# Patient Record
Sex: Female | Born: 1975 | Race: White | Hispanic: No | Marital: Married | Smoking: Never smoker
Health system: Southern US, Community
[De-identification: ages and names within clinical notes are randomized; demographics above are authoritative.]

## PROBLEM LIST (undated history)

## (undated) DIAGNOSIS — K602 Anal fissure, unspecified: Secondary | ICD-10-CM

## (undated) DIAGNOSIS — K644 Residual hemorrhoidal skin tags: Secondary | ICD-10-CM

## (undated) DIAGNOSIS — A6920 Lyme disease, unspecified: Secondary | ICD-10-CM

## (undated) DIAGNOSIS — K589 Irritable bowel syndrome without diarrhea: Secondary | ICD-10-CM

## (undated) DIAGNOSIS — K649 Unspecified hemorrhoids: Secondary | ICD-10-CM

## (undated) DIAGNOSIS — K319 Disease of stomach and duodenum, unspecified: Secondary | ICD-10-CM

## (undated) DIAGNOSIS — Z9889 Other specified postprocedural states: Secondary | ICD-10-CM

## (undated) DIAGNOSIS — Z8489 Family history of other specified conditions: Secondary | ICD-10-CM

## (undated) DIAGNOSIS — K219 Gastro-esophageal reflux disease without esophagitis: Secondary | ICD-10-CM

## (undated) DIAGNOSIS — R112 Nausea with vomiting, unspecified: Secondary | ICD-10-CM

## (undated) DIAGNOSIS — K648 Other hemorrhoids: Secondary | ICD-10-CM

## (undated) DIAGNOSIS — M341 CR(E)ST syndrome: Secondary | ICD-10-CM

## (undated) HISTORY — DX: Disease of stomach and duodenum, unspecified: K31.9

## (undated) HISTORY — DX: Cr(e)st syndrome: M34.1

## (undated) HISTORY — PX: ABDOMINAL HYSTERECTOMY: SHX81

## (undated) HISTORY — DX: Other hemorrhoids: K64.8

## (undated) HISTORY — PX: INCONTINENCE SURGERY: SHX676

## (undated) HISTORY — PX: TONSILLECTOMY: SUR1361

## (undated) HISTORY — DX: Irritable bowel syndrome, unspecified: K58.9

## (undated) HISTORY — DX: Gastro-esophageal reflux disease without esophagitis: K21.9

## (undated) HISTORY — DX: Residual hemorrhoidal skin tags: K64.4

## (undated) HISTORY — PX: CHOLECYSTECTOMY: SHX55

---
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
## 2001-12-07 HISTORY — PX: DILATION AND CURETTAGE OF UTERUS: SHX78

## 2003-12-08 HISTORY — PX: VAGINAL HYSTERECTOMY: SUR661

## 2004-06-11 ENCOUNTER — Ambulatory Visit (HOSPITAL_COMMUNITY): Admission: RE | Admit: 2004-06-11 | Discharge: 2004-06-11 | Payer: Self-pay | Admitting: Obstetrics & Gynecology

## 2004-11-04 ENCOUNTER — Inpatient Hospital Stay (HOSPITAL_COMMUNITY): Admission: RE | Admit: 2004-11-04 | Discharge: 2004-11-06 | Payer: Self-pay | Admitting: Obstetrics & Gynecology

## 2005-06-17 ENCOUNTER — Ambulatory Visit (HOSPITAL_COMMUNITY): Admission: RE | Admit: 2005-06-17 | Discharge: 2005-06-17 | Payer: Self-pay | Admitting: Obstetrics and Gynecology

## 2005-11-11 ENCOUNTER — Ambulatory Visit: Payer: Self-pay | Admitting: Internal Medicine

## 2007-01-28 ENCOUNTER — Ambulatory Visit: Payer: Self-pay | Admitting: Internal Medicine

## 2010-08-05 ENCOUNTER — Encounter (INDEPENDENT_AMBULATORY_CARE_PROVIDER_SITE_OTHER): Payer: Self-pay | Admitting: *Deleted

## 2011-01-08 NOTE — Letter (Signed)
Summary: Unable to Reach, Consult Scheduled  [facility]  [address]   [place], [place] [address]   Phone: [phone]  Fax: [phone]    08/05/2010  Autumn Beasley [address] [place], [place]  [address] 06/02/1976   Dear [name],  At the recommendation of DR VYAS  we have been asked to schedule you a consult with DR Jena Gauss for INTERNAL HEMORRHOIDS. Please call our office at [phone].     Thank you,    Diana Eves  [facility] R. Roetta Sessions, M.D.    Jonette Eva, M.D. Lorenza Burton, FNP-BC    Tana Coast, PA-C Phone: [phone]    Fax: [phone]

## 2011-11-02 ENCOUNTER — Telehealth: Payer: Self-pay | Admitting: Internal Medicine

## 2011-11-02 NOTE — Telephone Encounter (Signed)
Pt scheduled to see Dr. Juanda Chance 11/04/11@2pm . Pt aware of appt date and time.

## 2011-11-02 NOTE — Telephone Encounter (Signed)
Last OV 01/28/2007 after a bout with gastroenteritis and hospitalization. She was better at the visit and was given PPI samples and instructed to take a Probiotic or eat Activia yogurt. She reports Atypical chest pain and n/v last week and saw her PCP who instructed her to change her PPI to Nexium and ordered an ECHO for today. Pt reports today the n/v has improved, but she feels hungry; if she eats, she gets n/v again. Pt given an appt on 11/04/11.

## 2011-11-03 ENCOUNTER — Encounter: Payer: Self-pay | Admitting: *Deleted

## 2011-11-04 ENCOUNTER — Encounter: Payer: Self-pay | Admitting: Internal Medicine

## 2011-11-04 ENCOUNTER — Ambulatory Visit (INDEPENDENT_AMBULATORY_CARE_PROVIDER_SITE_OTHER): Payer: BC Managed Care – PPO | Admitting: Internal Medicine

## 2011-11-04 VITALS — BP 100/78 | HR 72 | Ht 62.0 in | Wt 188.6 lb

## 2011-11-04 DIAGNOSIS — R1013 Epigastric pain: Secondary | ICD-10-CM

## 2011-11-04 DIAGNOSIS — R079 Chest pain, unspecified: Secondary | ICD-10-CM

## 2011-11-04 NOTE — Progress Notes (Signed)
Autumn Beasley 1976-06-04 MRN [id number]    History of Present Illness:  This is a 35 year old white female with persistent epigastric and subxiphoid pain which has been bothering her for several months. It is worse at night but also after meals. She was on Prilosec 20 mg a day and has been switched to Nexium 40 mg a day and most recently to Nexium 40 mg twice a day. She has not noticed any improvement. She denies dysphagia or odynophagia. She underwent a cholecystectomy in 1997. She was hospitalized at [facility] in February 2008 with acute gastroenteritis and post viral diarrhea. She has recovered from that. She has had some loose stools but no blood per rectum.    Past Medical History  Diagnosis Date  . Migraine   . GERD (gastroesophageal reflux disease)   . IBS (irritable bowel syndrome)   . Hemorrhoids   . Asthma    Past Surgical History  Procedure Date  . Cholecystectomy   . Abdominal hysterectomy     reports that she has never smoked. She has never used smokeless tobacco. She reports that she does not drink alcohol or use illicit drugs. family history includes Alcohol abuse in her maternal grandfather; COPD in her maternal aunt; Crohn's disease in her sister; Diabetes in her maternal aunt and maternal grandmother; and Liver disease in her maternal aunt. Allergies  Allergen Reactions  . Morphine And Related   . Penicillins         Review of Systems: Positive for chest pain but negative for shortness of breath. Denies rectal bleeding  The remainder of the 10 point ROS is negative except as outlined in H&P   Physical Exam: General appearance  Well developed, in no distress. Eyes- non icteric. HEENT nontraumatic, normocephalic. Mouth no lesions, tongue papillated, no cheilosis. Neck supple without adenopathy, thyroid not enlarged, no carotid bruits, no JVD. Lungs Clear to auscultation bilaterally. Cor normal S1, normal S2, regular rhythm, no murmur,  quiet  precordium. Abdomen: Obese soft with tenderness in epigastrium in the midline. No tenderness in the left or right upper quadrant. Liver edge at costal margin. No CVA tenderness. Rectal: Soft Hemoccult negative stool. Extremities no pedal edema. Skin no lesions. Neurological alert and oriented x 3. Psychological normal mood and affect.  Assessment and Plan:  Problem #1 Chronic epigastric and substernal discomfort is likely of GI origin suggestive of esophagitis or gastritis. We will rule out peptic ulcer disease or H. pylori gastritis.  Problem #2 Status post remote cholecystectomy. We will proceed with an upper abdominal ultrasound and upper endoscopy to further evaluate her complaints.   11/04/2011 Lina Sar

## 2011-11-04 NOTE — Patient Instructions (Addendum)
You have been scheduled for an endoscopy. Please follow written instructions given to you at your visit today. You have been scheduled for an abdominal ultrasound at [facility] (1st floor of hospital) on Monday, 11/09/11 at 8:30 am. Please arrive 15 minutes prior to your appointment for registration. Make certain not to have anything to eat or drink 6 hours prior to your appointment. Should you need to reschedule your appointment, please contact radiology at [phone]. CC: Dr Despina Hidden, Dr Marlyn Corporal

## 2011-11-05 ENCOUNTER — Encounter: Payer: Self-pay | Admitting: Internal Medicine

## 2011-11-06 ENCOUNTER — Telehealth: Payer: Self-pay | Admitting: Internal Medicine

## 2011-11-06 NOTE — Telephone Encounter (Signed)
Patient states that she was supposed to get a medication to "coat her stomach." Dr Juanda Chance, did you want me to send patient carafate slurry?

## 2011-11-06 NOTE — Telephone Encounter (Signed)
No medication was to be sent to my knowledge. Patient may call back with further questions (she did not answer the phone)

## 2011-11-07 NOTE — Telephone Encounter (Signed)
Please send Carafate slurry 10cc po bid, #12 oz, 1 refill

## 2011-11-09 ENCOUNTER — Other Ambulatory Visit (HOSPITAL_COMMUNITY): Payer: BC Managed Care – PPO

## 2011-11-09 ENCOUNTER — Ambulatory Visit (HOSPITAL_COMMUNITY)
Admission: RE | Admit: 2011-11-09 | Discharge: 2011-11-09 | Disposition: A | Discharge status: Home / Self Care | Payer: BC Managed Care – PPO | Source: Ambulatory Visit | Attending: Internal Medicine | Admitting: Internal Medicine

## 2011-11-09 DIAGNOSIS — R079 Chest pain, unspecified: Secondary | ICD-10-CM

## 2011-11-09 DIAGNOSIS — K3189 Other diseases of stomach and duodenum: Secondary | ICD-10-CM | POA: Insufficient documentation

## 2011-11-09 DIAGNOSIS — R1013 Epigastric pain: Secondary | ICD-10-CM | POA: Insufficient documentation

## 2011-11-09 MED ORDER — SUCRALFATE 1 GM/10ML PO SUSP: 1.0000 g | Freq: Two times a day (BID) | ORAL | Status: DC

## 2011-11-09 NOTE — Telephone Encounter (Signed)
rx sent. Left message to advise patient.  

## 2011-11-10 ENCOUNTER — Ambulatory Visit (AMBULATORY_SURGERY_CENTER): Payer: BC Managed Care – PPO | Admitting: Internal Medicine

## 2011-11-10 ENCOUNTER — Encounter: Payer: Self-pay | Admitting: Internal Medicine

## 2011-11-10 DIAGNOSIS — K2970 Gastritis, unspecified, without bleeding: Secondary | ICD-10-CM

## 2011-11-10 DIAGNOSIS — K2990 Gastroduodenitis, unspecified, without bleeding: Secondary | ICD-10-CM

## 2011-11-10 DIAGNOSIS — R1013 Epigastric pain: Secondary | ICD-10-CM

## 2011-11-10 DIAGNOSIS — R079 Chest pain, unspecified: Secondary | ICD-10-CM

## 2011-11-10 DIAGNOSIS — K319 Disease of stomach and duodenum, unspecified: Secondary | ICD-10-CM

## 2011-11-10 MED ORDER — SODIUM CHLORIDE 0.9 % IV SOLN: 500.0000 mL | INTRAVENOUS | Status: DC

## 2011-11-10 MED ORDER — HYOSCYAMINE SULFATE 0.125 MG SL SUBL: 0.1250 mg | SUBLINGUAL_TABLET | SUBLINGUAL | Status: DC | PRN

## 2011-11-10 NOTE — Progress Notes (Signed)
Patient did not have preoperative order for IV antibiotic SSI prophylaxis. (G8918)  Patient did not experience any of the following events: a burn prior to discharge; a fall within the facility; wrong site/side/patient/procedure/implant event; or a hospital transfer or hospital admission upon discharge from the facility. (G8907)  

## 2011-11-10 NOTE — Patient Instructions (Signed)
Please read the handout given to you by  Your recovery room nurse.    Take the medication prescribed for you by Dr. Juanda Chance.  You may resume your routine medications today.   Your biopsies will be mailed to you within two weeks if they are normal.   If they are abnormal, we will call you.  Thank-you.   Call us if you need [place] at [phone].

## 2011-11-11 ENCOUNTER — Telehealth: Payer: Self-pay | Admitting: *Deleted

## 2011-11-11 NOTE — Telephone Encounter (Signed)
NO ANSWER, MESSAGE LEFT FOR PATIENT. 

## 2011-11-16 ENCOUNTER — Encounter: Payer: Self-pay | Admitting: Internal Medicine

## 2012-04-05 ENCOUNTER — Telehealth: Payer: Self-pay | Admitting: Internal Medicine

## 2012-04-05 NOTE — Telephone Encounter (Signed)
Patient states for the last few weeks she has had bright, red blood in stool. She went for her yearly GYN exam today and had + hemoccult. She was told to f/u with GI. Scheduled patient on 04/08/12 at 3:30 PM with Mike Gip, PA

## 2012-04-08 ENCOUNTER — Ambulatory Visit: Payer: BC Managed Care – PPO | Admitting: Physician Assistant

## 2012-08-22 ENCOUNTER — Encounter: Payer: Self-pay | Admitting: *Deleted

## 2012-08-23 ENCOUNTER — Encounter: Payer: Self-pay | Admitting: Internal Medicine

## 2012-08-23 ENCOUNTER — Ambulatory Visit (INDEPENDENT_AMBULATORY_CARE_PROVIDER_SITE_OTHER): Payer: BC Managed Care – PPO | Admitting: Internal Medicine

## 2012-08-23 VITALS — BP 100/70 | HR 92 | Ht 62.0 in | Wt 170.4 lb

## 2012-08-23 DIAGNOSIS — R1012 Left upper quadrant pain: Secondary | ICD-10-CM

## 2012-08-23 DIAGNOSIS — K625 Hemorrhage of anus and rectum: Secondary | ICD-10-CM

## 2012-08-23 MED ORDER — MOVIPREP 100 G PO SOLR: ORAL | Status: DC

## 2012-08-23 MED ORDER — HYDROCORTISONE ACE-PRAMOXINE 2.5-1 % RE CREA: TOPICAL_CREAM | Freq: Two times a day (BID) | RECTAL | Status: DC | PRN

## 2012-08-23 MED ORDER — HYOSCYAMINE SULFATE 0.125 MG SL SUBL: 0.1250 mg | SUBLINGUAL_TABLET | SUBLINGUAL | Status: DC | PRN

## 2012-08-23 NOTE — Progress Notes (Signed)
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 It occurs several times a week and is often relieved by having a bowel movement. The pain lasts several minutes to several hours and it may move from the epigastrium to the left lower quadrant. We have seen her in the past for epigastric discomfort and moderately severe antral gastritis seen on an upper endoscopy in December 2012. She has not had any problems with dyspepsia this time. An upper abdominal ultrasound in November 2012 showed a 4 mm common bile duct and post cholecystectomy state. She has been experiencing bright red blood per rectum and some irritation and pain with bowel movements. She denies being constipated. She had severe acute gastroenteritis in 2008 while on a cruise. She has taken Nexium 40 mg in the past, but currently on an as needed basis. She has used over-the-counter hemorrhoidal preparations. There is no family history of colon cancer.   Past Medical History  Diagnosis Date  . Migraine   . GERD (gastroesophageal reflux disease)   . IBS (irritable bowel syndrome)   . Hemorrhoids   . Asthma   . Gastropathy    Past Surgical History  Procedure Date  . Cholecystectomy   . Abdominal hysterectomy     reports that she has never smoked. She has never used smokeless tobacco. She reports that she does not drink alcohol or use illicit drugs. family history includes Alcohol abuse in her maternal grandfather; COPD in her maternal aunt; Crohn's disease in her sister; Diabetes in her maternal aunt and maternal grandmother; and Liver disease in her maternal aunt. Allergies  Allergen Reactions  . Morphine And Related   . Penicillins         Review of Systems: Denies heartburn dysphagia weight changes  The remainder of the 10 point ROS is negative  except as outlined in H&P   Physical Exam: General appearance  Well developed, in no distress. Eyes- non icteric. HEENT nontraumatic, normocephalic. Mouth no lesions, tongue papillated, no cheilosis. Neck supple without adenopathy, thyroid not enlarged, no carotid bruits, no JVD. Lungs Clear to auscultation bilaterally. Cor normal S1, normal S2, regular rhythm, no murmur,  quiet precordium. Abdomen: Minimal discomfort along the left costal margin, spleen not enlarged. No palpable mass. No CVA tenderness. Rectal: Soft Hemoccult negative stool. Extremities no pedal edema. Skin no lesions. Neurological alert and oriented x 3. Psychological normal mood and affect.  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 The symptoms seem to be relieved by having a bowel movement. Rectal bleeding is a separate problem and suggests an ano- rectal source. Beause of the concern about rectal bleeding and persistence of her symptoms, we will go ahead with a colonoscopy. She will start Levsin sublingually 0.125 mg when necessary for left upper quadrant discomfort and she will also use Analpram cream 2.5% when necessary for rectal irritation. She will continue a high fiber diet.   08/23/2012 Lina Sar

## 2012-08-23 NOTE — Patient Instructions (Addendum)
You have been scheduled for a colonoscopy with propofol. Please follow written instructions given to you at your visit today.  Please pick up your prep kit at the pharmacy within the next 1-3 days. If you use inhalers (even only as needed), please bring them with you on the day of your procedure. We have sent the following medications to your pharmacy for you to pick up at your convenience: Levsin SL Analpram CC: Dr Algis Downs. Sherril Croon

## 2012-10-25 ENCOUNTER — Encounter: Payer: Self-pay | Admitting: Internal Medicine

## 2012-10-25 ENCOUNTER — Ambulatory Visit (AMBULATORY_SURGERY_CENTER): Payer: BC Managed Care – PPO | Admitting: Internal Medicine

## 2012-10-25 VITALS — BP 112/75 | HR 66 | Temp 98.5°F | Resp 45 | Ht 62.0 in | Wt 170.0 lb

## 2012-10-25 DIAGNOSIS — K625 Hemorrhage of anus and rectum: Secondary | ICD-10-CM

## 2012-10-25 DIAGNOSIS — R1012 Left upper quadrant pain: Secondary | ICD-10-CM

## 2012-10-25 MED ORDER — SODIUM CHLORIDE 0.9 % IV SOLN: 500.0000 mL | INTRAVENOUS | Status: DC

## 2012-10-25 NOTE — Progress Notes (Addendum)
Patient did not have preoperative order for IV antibiotic SSI prophylaxis. (G8918)  Patient did not experience any of the following events: a burn prior to discharge; a fall within the facility; wrong site/side/patient/procedure/implant event; or a hospital transfer or hospital admission upon discharge from the facility. (G8907)  

## 2012-10-25 NOTE — Op Note (Signed)
[facility] [address] [place], [address]   COLONOSCOPY PROCEDURE REPORT  PATIENT: Autumn, Beasley  MR#: [id number] BIRTHDATE: 18-Dec-1975 , 36  yrs. old GENDER: Female ENDOSCOPIST: Hart Carwin, MD REFERRED BY:  Doreen Beam, M.D. PROCEDURE DATE:  10/25/2012 PROCEDURE:   Colonoscopy, diagnostic ASA CLASS:   Class I INDICATIONS:abdominal pain in the upper left quadrant and hematochezia. MEDICATIONS: MAC sedation, administered by CRNA and propofol (Diprivan) 300mg  IV  DESCRIPTION OF PROCEDURE:   After the risks and benefits and of the procedure were explained, informed consent was obtained.  A digital rectal exam revealed no abnormalities of the rectum.    The LB [id number]  endoscope was introduced through the anus and advanced to the cecum, which was identified by both the appendix and ileocecal valve .  The quality of the prep was good, using MoviPrep .  The instrument was then slowly withdrawn as the colon was fully examined.     COLON FINDINGS: Internal hemorrhoids were found.     Retroflexed views revealed no abnormalities.     The scope was then withdrawn from the patient and the procedure completed.  COMPLICATIONS: There were no complications. ENDOSCOPIC IMPRESSION: Internal hemorrhoids , nothing to account for the LUQ abd. 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 Pt reported resolution of the pain after the bowl prep  RECOMMENDATIONS: High fiber diet continue Levsin SL.125 mg q 4 hrs prn Anusol HC supp prn rectal bleeding   REPEAT EXAM: In 15 year(s)  for Colonoscopy.  at age 46  cc:  _______________________________ eSigned:  Hart Carwin, MD 10/25/2012 1:51 PM     PATIENT NAME:  Autumn, Beasley MR#: [id number]

## 2012-10-25 NOTE — Patient Instructions (Addendum)
YOU HAD AN ENDOSCOPIC PROCEDURE TODAY AT THE [facility]: Refer to the procedure report that was given to you for any specific questions about what was found during the examination.  If the procedure report does not answer your questions, please call your gastroenterologist to clarify.  If you requested that your care partner not be given the details of your procedure findings, then the procedure report has been included in a sealed envelope for you to review at your convenience later.  YOU SHOULD EXPECT: Some feelings of bloating in the abdomen. Passage of more gas than usual.  Walking can help get rid of the air that was put into your GI tract during the procedure and reduce the bloating. If you had a lower endoscopy (such as a colonoscopy or flexible sigmoidoscopy) you may notice spotting of blood in your stool or on the toilet paper. If you underwent a bowel prep for your procedure, then you may not have a normal bowel movement for a few days.  DIET: Your first meal following the procedure should be a light meal and then it is ok to progress to your normal diet.  A half-sandwich or bowl of soup is an example of a good first meal.  Heavy or fried foods are harder to digest and may make you feel nauseous or bloated.  Likewise meals heavy in dairy and vegetables can cause extra gas to form and this can also increase the bloating.  Drink plenty of fluids but you should avoid alcoholic beverages for 24 hours.  ACTIVITY: Your care partner should take you home directly after the procedure.  You should plan to take it easy, moving slowly for the rest of the day.  You can resume normal activity the day after the procedure however you should NOT DRIVE or use heavy machinery for 24 hours (because of the sedation medicines used during the test).    SYMPTOMS TO REPORT IMMEDIATELY: A gastroenterologist can be reached at any hour.  During normal business hours, 8:30 AM to 5:00 PM Monday through Friday,  call [phone].  After hours and on weekends, please call the GI answering service at [phone] who will take a message and have the physician on call contact you.   Following lower endoscopy (colonoscopy or flexible sigmoidoscopy):  Excessive amounts of blood in the stool  Significant tenderness or worsening of abdominal pains  Swelling of the abdomen that is new, acute  Fever of 100F or higher  FOLLOW UP: If any biopsies were taken you will be contacted by phone or by letter within the next 1-3 weeks.  Call your gastroenterologist if you have not heard about the biopsies in 3 weeks.  Our staff will call the home number listed on your records the next business day following your procedure to check on you and address any questions or concerns that you may have at that time regarding the information given to you following your procedure. This is a courtesy call and so if there is no answer at the home number and we have not heard from you through the emergency physician on call, we will assume that you have returned to your regular daily activities without incident.  SIGNATURES/CONFIDENTIALITY: You and/or your care partner have signed paperwork which will be entered into your electronic medical record.  These signatures attest to the fact that that the information above on your After Visit Summary has been reviewed and is understood.  Full responsibility of the confidentiality of this   discharge information lies with you and/or your care-partner.   Thank-you for choosing us for your healthcare needs. 

## 2012-10-26 ENCOUNTER — Telehealth: Payer: Self-pay

## 2012-10-26 NOTE — Telephone Encounter (Signed)
  Follow up Call-  Call back number 10/25/2012 11/10/2011  Post procedure Call Back phone  # [phone]===OK TO LEAVE MESSAGE  Permission to leave phone message Yes -     Patient questions:  Do you have a fever, pain , or abdominal swelling? no Pain Score  0 *  Have you tolerated food without any problems? yes  Have you been able to return to your normal activities? yes  Do you have any questions about your discharge instructions: Diet   no Medications  no Follow up visit  no  Do you have questions or concerns about your Care? no  Actions: * If pain score is 4 or above: No action needed, pain <4.

## 2012-12-14 ENCOUNTER — Encounter: Payer: Self-pay | Admitting: *Deleted

## 2013-01-04 ENCOUNTER — Telehealth: Payer: Self-pay | Admitting: Internal Medicine

## 2013-01-04 ENCOUNTER — Ambulatory Visit: Payer: BC Managed Care – PPO | Admitting: Internal Medicine

## 2013-01-04 MED ORDER — HYDROCORTISONE ACETATE 25 MG RE SUPP: RECTAL | Status: DC

## 2013-01-04 NOTE — Telephone Encounter (Signed)
Please send Anusol HC supp ,#12, insert 1 bid x 1 week then 1 hs, 3 refill.

## 2013-01-04 NOTE — Telephone Encounter (Signed)
Patient had an OV scheduled today but she cannot make it due to bad roads. She had a colonoscopy 10/2012. She has internal hemorrhoids. She has been using the cream but it still having rectal bleeding and painful bowel movements. She is doing sitz baths. Reviewed all hemorrhoid care instructions. She is wondering if she could get suppositories. Please, advise.

## 2013-01-04 NOTE — Telephone Encounter (Signed)
Rx sent. Patient notified. She will call us back if this does not help.

## 2014-04-19 DIAGNOSIS — Z029 Encounter for administrative examinations, unspecified: Secondary | ICD-10-CM

## 2015-02-12 ENCOUNTER — Telehealth: Payer: Self-pay | Admitting: Internal Medicine

## 2015-02-12 NOTE — Telephone Encounter (Signed)
Spoke with patient and she went to [facility] ED on last Thursday. She had a CT and was told she had gastritis. She is still feeling terrible. C/O abdominal pain, swelling and diarrhea. Scheduled with Alonza Bogus, PA on 02/13/15 at 1:30 PM.

## 2015-02-13 ENCOUNTER — Ambulatory Visit (INDEPENDENT_AMBULATORY_CARE_PROVIDER_SITE_OTHER): Payer: BLUE CROSS/BLUE SHIELD | Admitting: Gastroenterology

## 2015-02-13 ENCOUNTER — Encounter: Payer: Self-pay | Admitting: Gastroenterology

## 2015-02-13 VITALS — BP 100/70 | HR 72 | Ht 62.0 in | Wt 162.4 lb

## 2015-02-13 DIAGNOSIS — R1033 Periumbilical pain: Secondary | ICD-10-CM | POA: Insufficient documentation

## 2015-02-13 MED ORDER — DICYCLOMINE HCL 10 MG PO CAPS: 10.0000 mg | ORAL_CAPSULE | Freq: Two times a day (BID) | ORAL | Status: DC

## 2015-02-13 NOTE — Patient Instructions (Signed)
Your provider has recommended you purchase a bottle of "magnesium citrate" from your pharmacy. Please drink this tonight as instructed.  You will need to pick up your prescription from your pharmacy. It has been electronically transmitted to them. Please call us back on Friday and give [place] an update on your symptoms.

## 2015-02-13 NOTE — Progress Notes (Signed)
     02/13/2015 Autumn Beasley [id number] [birth year]   History of Present Illness:  This is a 39 year old female who is known to Dr. Olevia Perches.  She had EGD in 11/2011 that showed moderate gastritis, negative Hpylori.  Colonoscopy 10/2012 that showed only internal hemorrhoids.  She presents to our office today with complaints of peri-umbilical abdominal pain that has been present for the past 10 days.  Pain has been severe enough that she's had to take hydrocodone that she had at home. Pain is constant, but comes in sharp contraction type pains at times and other times it is described as a tearing or ripping pain.  Has some nausea, but no vomiting.  No fevers or chill.  She was on a BRAT diet for a while, but the pain is just persisting each day.  Says that she took a levsin on one occasion, but it did not seem to help.  She had an abdominal x-ray performed by PCP on 02/06/2015, which showed mildly increased colonic stool burden.  Then she went to the ED in [place] on 02/07/2015 at which time a CT scan of the abdomen and pelvis with contrast did not show any acute findings or explanation for her symptoms.  CBC and CMP were normal.  She is concerned, however, because the CT scan mentions that she has two normal appearing ovaries, but she says that she had her right ovary removed during her hysterectomy; she is not confident that the CT scan was accurate or read appropriately and "maybe they missed something else".  Says that she does not really feel constipated.  It appears that she had been complaining of abdominal pains back during the time of her colonoscopy that were apparently relieved by taking the bowel prep, according to the colonoscopy report.  She says that the pain that she has now is different and that she's never had pain like this before.   Current Medications, Allergies, Past Medical History, Past Surgical History, Family History and Social History were reviewed in [employer]  record.   Physical Exam: BP 100/70 mmHg  Pulse 72  Ht 5\' 2"  (1.575 m)  Wt 162 lb 6.4 oz (73.664 kg)  BMI 29.70 kg/m2 General: Well developed white female in no acute distress Head: Normocephalic and atraumatic Eyes:  Sclerae anicteric, conjunctiva pink  Ears: Normal auditory acuity Lungs: Clear throughout to auscultation Heart: Regular rate and rhythm Abdomen: Soft, non-distended.  Normal bowel sounds.  Mild peri-umbilical TTP but abdominal exam is benign. Musculoskeletal: Symmetrical with no gross deformities  Extremities: No edema  Neurological: Alert oriented x 4, grossly non-focal Psychological:  Alert and cooperative. Normal mood and affect  Assessment and Recommendations: -Peri-umbilical abdominal pain x 10 days:  Unsure of the cause of her pain, but negative CT scan and unremarkable labs is reassuring.  She had increased stool burden on abdominal xray so ? if her pain is related to this (it appears that she had complaints of abdominal pain at the time of her colonoscopy in the past that seemed to resolve with bowel prep).  Will have her drink a bottle of magnesium citrate tonight.  Will try Bentyl 10 mg BID for spasm/cramping.  She will call back with an update on Friday.

## 2015-02-14 NOTE — Progress Notes (Signed)
Reviewed and agree. Would have radiologist to review CT scan and dictate an addendum if appropriate.

## 2015-03-07 ENCOUNTER — Ambulatory Visit: Payer: Self-pay | Admitting: Internal Medicine

## 2015-12-08 DIAGNOSIS — A6920 Lyme disease, unspecified: Secondary | ICD-10-CM

## 2015-12-08 HISTORY — DX: Lyme disease, unspecified: A69.20

## 2016-03-09 ENCOUNTER — Encounter: Payer: Self-pay | Admitting: Obstetrics & Gynecology

## 2016-03-09 ENCOUNTER — Ambulatory Visit (INDEPENDENT_AMBULATORY_CARE_PROVIDER_SITE_OTHER): Payer: Commercial Managed Care - PPO | Admitting: Obstetrics & Gynecology

## 2016-03-09 VITALS — BP 110/60 | HR 62 | Ht 62.0 in | Wt 166.0 lb

## 2016-03-09 DIAGNOSIS — Z01419 Encounter for gynecological examination (general) (routine) without abnormal findings: Secondary | ICD-10-CM

## 2016-03-09 DIAGNOSIS — M791 Myalgia: Secondary | ICD-10-CM

## 2016-03-09 DIAGNOSIS — M7918 Myalgia, other site: Secondary | ICD-10-CM

## 2016-03-09 MED ORDER — PREDNISONE 10 MG PO TABS: 10.0000 mg | ORAL_TABLET | Freq: Every day | ORAL | Status: DC

## 2016-03-09 MED ORDER — FLUCONAZOLE 150 MG PO TABS: 150.0000 mg | ORAL_TABLET | Freq: Once | ORAL | Status: DC

## 2016-03-09 NOTE — Progress Notes (Signed)
Patient ID: Autumn Beasley, female   DOB: 18-Feb-1976, 40 y.o.   MRN: [id number] Subjective:     Autumn Beasley is a 40 y.o. female here for a routine exam.  No LMP recorded. Patient has had a hysterectomy. No obstetric history on file. Birth Control Method:  hysterectomy Menstrual Calendar(currently): hysterectomy  Current complaints: abdomen pain for 2 weeks.   Current acute medical issues:  Abdominal pain has had CT last week which was negative Also with some alternating diarrhea and consitpation on levsin   Recent Gynecologic History No LMP recorded. Patient has had a hysterectomy. Last Pap: na,   Last mammogram: ,    Past Medical History  Diagnosis Date  . Migraine   . GERD (gastroesophageal reflux disease)   . IBS (irritable bowel syndrome)   . Hemorrhoids   . Asthma   . Gastropathy   . Internal hemorrhoids     Past Surgical History  Procedure Laterality Date  . Cholecystectomy    . Abdominal hysterectomy    . Tonsillectomy      OB History    No data available      Social History   Social History  . Marital Status: Single    Spouse Name: N/A  . Number of Children:  2  . Years of Education: N/A   Occupational History  .     Social History Main Topics  . Smoking status: Never Smoker   . Smokeless tobacco: Never Used  . Alcohol Use: No  . Drug Use: No  . Sexual Activity: Not Asked   Other Topics Concern  . None   Social History Narrative    Family History  Problem Relation Age of Onset  . Diabetes Maternal Aunt   . Diabetes Maternal Grandmother   . Alcohol abuse Maternal Grandfather   . Crohn's disease Sister   . Liver disease Maternal Aunt   . COPD Maternal Aunt   . Colon cancer Neg Hx   . Rectal cancer Neg Hx   . Stomach cancer Neg Hx      Current outpatient prescriptions:  .  topiramate (TOPAMAX) 100 MG tablet, Take 100 mg by mouth 2 (two) times daily.  , Disp: , Rfl:  .  dicyclomine (BENTYL) 10 MG capsule, Take 1 capsule (10 mg  total) by mouth 2 (two) times daily. (Patient not taking: Reported on 03/09/2016), Disp: 60 capsule, Rfl: 1 .  fluconazole (DIFLUCAN) 150 MG tablet, Take 1 tablet (150 mg total) by mouth once. Take the second tablet 3 days after the first one., Disp: 2 tablet, Rfl: 0 .  hyoscyamine (LEVSIN/SL) 0.125 MG SL tablet, Place 1 tablet (0.125 mg total) under the tongue every 4 (four) hours as needed for cramping., Disp: 60 tablet, Rfl: 2 .  predniSONE (DELTASONE) 10 MG tablet, Take 1 tablet (10 mg total) by mouth daily with breakfast., Disp: 40 tablet, Rfl: 0  Review of Systems  Review of Systems  Constitutional: Negative for fever, chills, weight loss, malaise/fatigue and diaphoresis.  HENT: Negative for hearing loss, ear pain, nosebleeds, congestion, sore throat, neck pain, tinnitus and ear discharge.   Eyes: Negative for blurred vision, double vision, photophobia, pain, discharge and redness.  Respiratory: Negative for cough, hemoptysis, sputum production, shortness of breath, wheezing and stridor.   Cardiovascular: Negative for chest pain, palpitations, orthopnea, claudication, leg swelling and PND.  Gastrointestinal: negative for abdominal pain. Negative for heartburn, nausea, vomiting, diarrhea, constipation, blood in stool and melena.  Genitourinary: Negative for dysuria,  urgency, frequency, hematuria and flank pain.  Musculoskeletal: Negative for myalgias, back pain, joint pain and falls.  Skin: Negative for itching and rash.  Neurological: Negative for dizziness, tingling, tremors, sensory change, speech change, focal weakness, seizures, loss of consciousness, weakness and headaches.  Endo/Heme/Allergies: Negative for environmental allergies and polydipsia. Does not bruise/bleed easily.  Psychiatric/Behavioral: Negative for depression, suicidal ideas, hallucinations, memory loss and substance abuse. The patient is not nervous/anxious and does not have insomnia.        Objective:  Blood  pressure 110/60, pulse 62, height 5\' 2"  (1.575 m), weight 166 lb (75.297 kg).   Physical Exam  Vitals reviewed. Constitutional: She is oriented to person, place, and time. She appears well-developed and well-nourished.  HENT:  Head: Normocephalic and atraumatic.        Right Ear: External ear normal.  Left Ear: External ear normal.  Nose: Nose normal.  Mouth/Throat: Oropharynx is clear and moist.  Eyes: Conjunctivae and EOM are normal. Pupils are equal, round, and reactive to light. Right eye exhibits no discharge. Left eye exhibits no discharge. No scleral icterus.  Neck: Normal range of motion. Neck supple. No tracheal deviation present. No thyromegaly present.  Cardiovascular: Normal rate, regular rhythm, normal heart sounds and intact distal pulses.  Exam reveals no gallop and no friction rub.   No murmur heard. Respiratory: Effort normal and breath sounds normal. No respiratory distress. She has no wheezes. She has no rales. She exhibits no tenderness.  GI: Soft. Bowel sounds are normal. She exhibits no distension and no mass. There is no tenderness. There is no rebound and no guarding.  Genitourinary:  Breasts no masses skin changes or nipple changes bilaterally      Vulva is normal without lesions Vagina is pink moist minimal  Discharge yeast Cervix absent Uterus is normal size shape and contour Adnexa is negative with normal sized ovaries   Musculoskeletal: Normal range of motion. She exhibits no edema and no tenderness.  Neurological: She is alert and oriented to person, place, and time. She has normal reflexes. She displays normal reflexes. No cranial nerve deficit. She exhibits normal muscle tone. Coordination normal.  Skin: Skin is warm and dry. No rash noted. No erythema. No pallor.  Psychiatric: She has a normal mood and affect. Her behavior is normal. Judgment and thought content normal.       Assessment:    Healthy female exam.    Plan:    Follow up in: 2 weeks.     Meds ordered this encounter  Medications  . fluconazole (DIFLUCAN) 150 MG tablet    Sig: Take 1 tablet (150 mg total) by mouth once. Take the second tablet 3 days after the first one.    Dispense:  2 tablet    Refill:  0  . predniSONE (DELTASONE) 10 MG tablet    Sig: Take 1 tablet (10 mg total) by mouth daily with breakfast.    Dispense:  40 tablet    Refill:  0    Will give course of prednisone and follow up in 2 weeks to re evlauate, if improves on prednisone will probably try cymbalta   No orders of the defined types were placed in this encounter.    Trigger Point Injection   Pre-operative diagnosis: myofascial pain  Post-operative diagnosis: myofascial pain  After risks and benefits were explained including bleeding, infection, worsening of the pain, damage to the area being injected, weakness, allergic reaction to medications, vascular injection, and nerve damage, signed  consent was obtained.  All questions were answered.    The area of the trigger point was identified and the skin prepped three times with alcohol and the alcohol allowed to dry.  Next, a 25 gauge 0.5 inch needle was placed in the area of the trigger point.  Once reproduction of the pain was elicited and negative aspiration confirmed, the trigger point was injected and the needle removed.    The patient did tolerate the procedure well and there were not complications.    Medication used:  20 cc of 0.5% marcaine    Trigger points injected: 4    Trigger point(s) location(s):  bilateral abdominal wall  Pain was 3-4 escalating to 6 pre procedure and is at 0 now post procedure

## 2016-03-23 ENCOUNTER — Ambulatory Visit: Payer: Commercial Managed Care - PPO | Admitting: Obstetrics & Gynecology

## 2016-04-08 ENCOUNTER — Ambulatory Visit: Payer: Commercial Managed Care - PPO | Admitting: Obstetrics & Gynecology

## 2017-04-22 ENCOUNTER — Emergency Department (HOSPITAL_COMMUNITY)
Admission: EM | Admit: 2017-04-22 | Discharge: 2017-04-22 | Disposition: A | Discharge status: Home / Self Care | Payer: Commercial Managed Care - PPO | Attending: Emergency Medicine | Admitting: Emergency Medicine

## 2017-04-22 ENCOUNTER — Encounter (HOSPITAL_COMMUNITY): Payer: Self-pay | Admitting: Emergency Medicine

## 2017-04-22 ENCOUNTER — Emergency Department (HOSPITAL_COMMUNITY): Payer: Commercial Managed Care - PPO

## 2017-04-22 DIAGNOSIS — R58 Hemorrhage, not elsewhere classified: Secondary | ICD-10-CM | POA: Diagnosis present

## 2017-04-22 DIAGNOSIS — Z79899 Other long term (current) drug therapy: Secondary | ICD-10-CM | POA: Diagnosis not present

## 2017-04-22 DIAGNOSIS — K922 Gastrointestinal hemorrhage, unspecified: Secondary | ICD-10-CM | POA: Insufficient documentation

## 2017-04-22 DIAGNOSIS — J45909 Unspecified asthma, uncomplicated: Secondary | ICD-10-CM | POA: Insufficient documentation

## 2017-04-22 HISTORY — DX: Lyme disease, unspecified: A69.20

## 2017-04-22 LAB — COMPREHENSIVE METABOLIC PANEL
ALT: 32 U/L (ref 14–54)
ANION GAP: 6 (ref 5–15)
AST: 40 U/L (ref 15–41)
Albumin: 4.2 g/dL (ref 3.5–5.0)
Alkaline Phosphatase: 55 U/L (ref 38–126)
BUN: 17 mg/dL (ref 6–20)
CHLORIDE: 107 mmol/L (ref 101–111)
CO2: 23 mmol/L (ref 22–32)
Calcium: 9.2 mg/dL (ref 8.9–10.3)
Creatinine, Ser: 0.88 mg/dL (ref 0.44–1.00)
GFR calc non Af Amer: >60 mL/min (ref >60)
Glucose, Bld: 81 mg/dL (ref 65–99)
POTASSIUM: 4.2 mmol/L (ref 3.5–5.1)
SODIUM: 136 mmol/L (ref 135–145)
Total Bilirubin: 0.7 mg/dL (ref 0.3–1.2)
Total Protein: 7.4 g/dL (ref 6.5–8.1)

## 2017-04-22 LAB — POC OCCULT BLOOD, ED: Fecal Occult Bld: POSITIVE — AB

## 2017-04-22 LAB — CBC
HCT: 45.5 % (ref 36.0–46.0)
HEMOGLOBIN: 15.9 g/dL — AB (ref 12.0–15.0)
MCH: 30.5 pg (ref 26.0–34.0)
MCHC: 34.9 g/dL (ref 30.0–36.0)
MCV: 87.2 fL (ref 78.0–100.0)
Platelets: 138 10*3/uL — ABNORMAL LOW (ref 150–400)
RBC: 5.22 MIL/uL — AB (ref 3.87–5.11)
RDW: 13.2 % (ref 11.5–15.5)
WBC: 7.6 10*3/uL (ref 4.0–10.5)

## 2017-04-22 LAB — TYPE AND SCREEN
ABO/RH(D): O POS
Antibody Screen: NEGATIVE

## 2017-04-22 LAB — POC URINE PREG, ED: PREG TEST UR: NEGATIVE

## 2017-04-22 MED ORDER — IOPAMIDOL (ISOVUE-300) INJECTION 61%
100.0000 mL | Freq: Once | INTRAVENOUS | Status: AC | PRN
Start: 2017-04-22 — End: 2017-04-22
  Administered 2017-04-22: 100 mL via INTRAVENOUS

## 2017-04-22 MED ORDER — SODIUM CHLORIDE 0.9 % IV SOLN
1000.0000 mL | INTRAVENOUS | Status: DC
  Administered 2017-04-22: 1000 mL via INTRAVENOUS

## 2017-04-22 MED ORDER — RANITIDINE HCL 150 MG PO TABS: 150.0000 mg | ORAL_TABLET | Freq: Two times a day (BID) | ORAL | 0 refills | Status: DC

## 2017-04-22 MED ORDER — FAMOTIDINE IN NACL 20-0.9 MG/50ML-% IV SOLN
20.0000 mg | Freq: Once | INTRAVENOUS | Status: AC
  Administered 2017-04-22: 20 mg via INTRAVENOUS
  Filled 2017-04-22: qty 50

## 2017-04-22 MED ORDER — PANTOPRAZOLE SODIUM 20 MG PO TBEC: 20.0000 mg | DELAYED_RELEASE_TABLET | Freq: Two times a day (BID) | ORAL | 0 refills | Status: DC

## 2017-04-22 MED ORDER — SODIUM CHLORIDE 0.9 % IV BOLUS (SEPSIS)
1000.0000 mL | Freq: Once | INTRAVENOUS | Status: AC
  Administered 2017-04-22: 1000 mL via INTRAVENOUS

## 2017-04-22 MED ORDER — SUCRALFATE 1 GM/10ML PO SUSP: 1.0000 g | Freq: Three times a day (TID) | ORAL | 0 refills | Status: DC

## 2017-04-22 MED ORDER — PANTOPRAZOLE SODIUM 40 MG IV SOLR
40.0000 mg | Freq: Once | INTRAVENOUS | Status: AC
  Administered 2017-04-22: 40 mg via INTRAVENOUS
  Filled 2017-04-22: qty 40

## 2017-04-22 NOTE — ED Notes (Signed)
Patient transported to CT 

## 2017-04-22 NOTE — ED Triage Notes (Signed)
Per daughter, she heard the pt call out this morning from the bedroom and found her lying across the bed unable to get up.  States she was not making any sense and could not get her sentences out completely.  Could not get pt to the car, gave some Gatorade and improved.  Pt denies these symptoms currently.  Admits to having dark red blood in her stool for "quite a while now" with some abd pain.

## 2017-04-22 NOTE — Discharge Instructions (Signed)
Your history includes blood in your stool. Your test today shows hidden blood in the stool. Your hemoglobin and hematocrit are within normal limits at this time. Your platelet count is slightly below the normal limits. Your vital signs within normal limits. Please see Dr. Buford Dresser for gastrointestinal evaluation as soon as possible. Please increase fluids. Please use Carafate, Zantac and Protonix daily. Please avoid all aspirin and aspirin related products. Avoid alcohol. Please return to the emergency department if your symptoms worsen before your seen by Dr. Buford Dresser.

## 2017-04-22 NOTE — ED Notes (Signed)
Patient ambulatory to the restroom.  Patient denies any dizziness, states "I just don't fee right - I am weak"

## 2017-04-22 NOTE — ED Provider Notes (Signed)
[facility] DEPT Provider Note   CSN: [id number] Arrival date & time: 04/22/17  0854     History   Chief Complaint Chief Complaint  Patient presents with  . Rectal Bleeding    HPI Autumn Beasley is a 41 y.o. female.  Patient is a 41 year old female female who presents to the emergency department with a complaint of rectal bleeding.  The patient states she has been having problems off and on with rectal bleeding for at least 2 months on. On yesterday she had a very large stool with a lot of blood in it, she had a second one this morning. After this episode the patient had a period of weakness. She laid across the bed. She had to call for her daughter to help her get up. And even when the daughter came to help her get up she had a sensation of lightheadedness for short. Of time at one point the daughter says that she did not seem to be able to talk. She was given some Gatorade and was gradually able to speak and to get up. She was gradually able to have a conversation and she was brought to the emergency department for evaluation. She denies the use of aspirin products. She denies use of alcohol. She has a history of hemorrhoids. He has a history of irritable bowel syndrome, and she's been evaluated for GI problems and GI bleed in the past.      Past Medical History:  Diagnosis Date  . Asthma   . Gastropathy   . GERD (gastroesophageal reflux disease)   . Hemorrhoids   . IBS (irritable bowel syndrome)   . Internal hemorrhoids   . Lyme disease   . Migraine     Patient Active Problem List   Diagnosis Date Noted  . Periumbilical abdominal pain 02/13/2015    Past Surgical History:  Procedure Laterality Date  . ABDOMINAL HYSTERECTOMY    . CHOLECYSTECTOMY    . TONSILLECTOMY      OB History    No data available       Home Medications    Prior to Admission medications   Medication Sig Start Date End Date Taking? Authorizing Provider  calcium carbonate (TUMS - DOSED  IN MG ELEMENTAL CALCIUM) 500 MG chewable tablet Chew 1 tablet by mouth daily.   Yes [provider]  Ibuprofen 200 MG CAPS Take 400 mg by mouth every 6 (six) hours as needed for pain.   Yes [provider]  NALTREXONE HCL PO Take 4.15 mg by mouth at bedtime.    Yes [provider]  oxymetazoline (AFRIN) 0.05 % nasal spray Place 1 spray into both nostrils 2 (two) times daily as needed for congestion.   Yes [provider]  topiramate (TOPAMAX) 100 MG tablet Take 100 mg by mouth daily.    Yes [provider]  dicyclomine (BENTYL) 10 MG capsule Take 1 capsule (10 mg total) by mouth 2 (two) times daily. Patient not taking: Reported on 03/09/2016 02/13/15   Zehr, Laban Emperor, PA-C  fluconazole (DIFLUCAN) 150 MG tablet Take 1 tablet (150 mg total) by mouth once. Take the second tablet 3 days after the first one. Patient not taking: Reported on 04/22/2017 03/09/16   Florian Buff, MD  hyoscyamine (LEVSIN/SL) 0.125 MG SL tablet Place 1 tablet (0.125 mg total) under the tongue every 4 (four) hours as needed for cramping. 11/10/11 11/20/11  Lafayette Dragon, MD  predniSONE (DELTASONE) 10 MG tablet Take 1 tablet (  10 mg total) by mouth daily with breakfast. Patient not taking: Reported on 04/22/2017 03/09/16   Florian Buff, MD    Family History Family History  Problem Relation Age of Onset  . Diabetes Maternal Aunt   . Diabetes Maternal Grandmother   . Alcohol abuse Maternal Grandfather   . Crohn's disease Sister   . Liver disease Maternal Aunt   . COPD Maternal Aunt   . Colon cancer Neg Hx   . Rectal cancer Neg Hx   . Stomach cancer Neg Hx     Social History Social History  Substance Use Topics  . Smoking status: Never Smoker  . Smokeless tobacco: Never Used  . Alcohol use No     Allergies   Morphine and related and Penicillins   Review of Systems Review of Systems  Constitutional: Negative for activity change.       All ROS Neg except as noted in HPI   HENT: Negative for nosebleeds.   Eyes: Negative for photophobia and discharge.  Respiratory: Negative for cough, shortness of breath and wheezing.   Cardiovascular: Negative for chest pain and palpitations.  Gastrointestinal: Positive for blood in stool and diarrhea. Negative for abdominal pain.  Genitourinary: Negative for dysuria, frequency and hematuria.  Musculoskeletal: Negative for arthralgias, back pain and neck pain.  Skin: Negative.   Neurological: Negative for dizziness, seizures and speech difficulty.  Hematological: Does not bruise/bleed easily.  Psychiatric/Behavioral: Negative for confusion and hallucinations.     Physical Exam Updated Vital Signs BP 108/66   Pulse 73   Temp 98.1 F (36.7 C) (Oral)   Resp 16   Ht 5\' 2"  (1.575 m)   Wt 81.6 kg   SpO2 100%   BMI 32.92 kg/m   Physical Exam  Constitutional: She is oriented to person, place, and time. She appears well-developed and well-nourished.  Non-toxic appearance.  HENT:  Head: Normocephalic.  Right Ear: Tympanic membrane and external ear normal.  Left Ear: Tympanic membrane and external ear normal.  Eyes: EOM and lids are normal. Pupils are equal, round, and reactive to light.  Neck: Normal range of motion. Neck supple. Carotid bruit is not present.  Cardiovascular: Normal rate, regular rhythm, normal heart sounds, intact distal pulses and normal pulses.   Pulmonary/Chest: Breath sounds normal. No respiratory distress.  Abdominal: Soft. Bowel sounds are normal. There is no tenderness. There is no guarding.  Genitourinary: Rectal exam shows guaiac positive stool. Rectal exam shows no external hemorrhoid, no internal hemorrhoid, no fissure, no mass, no tenderness and anal tone normal. Pelvic exam was performed with patient prone.  Genitourinary Comments: Chaperone present during the exam.  Musculoskeletal: Normal range of motion.  Lymphadenopathy:       Head (right side): No submandibular adenopathy present.         Head (left side): No submandibular adenopathy present.    She has no cervical adenopathy.  Neurological: She is alert and oriented to person, place, and time. She has normal strength. No cranial nerve deficit or sensory deficit.  Skin: Skin is warm and dry.  Psychiatric: She has a normal mood and affect. Her speech is normal.  Nursing note and vitals reviewed.    ED Treatments / Results  Labs (all labs ordered are listed, but only abnormal results are displayed) Labs Reviewed  CBC - Abnormal; Notable for the following:       Result Value   RBC 5.22 (*)    Hemoglobin 15.9 (*)    Platelets 138 (*)  All other components within normal limits  POC OCCULT BLOOD, ED - Abnormal; Notable for the following:    Fecal Occult Bld POSITIVE (*)    All other components within normal limits  COMPREHENSIVE METABOLIC PANEL  POC URINE PREG, ED  TYPE AND SCREEN    EKG  EKG Interpretation  Date/Time:  Thursday Apr 22 2017 09:03:09 EDT Ventricular Rate:  83 PR Interval:    QRS Duration: 98 QT Interval:  366 QTC Calculation: 430 R Axis:   81 Text Interpretation:  Sinus rhythm Baseline wander No old tracing to compare Confirmed by [facility]  MD, Nunzio Cory [phone]) on 04/22/2017 9:33:19 AM       Radiology Ct Abdomen Pelvis W Contrast  Result Date: 04/22/2017 CLINICAL DATA:  Heme+stools x weeks per pt., today n/v/d with diaphoresis, hx of ibs, hyster. And chole., EXAM: CT ABDOMEN AND PELVIS WITH CONTRAST TECHNIQUE: Multidetector CT imaging of the abdomen and pelvis was performed using the standard protocol following bolus administration of intravenous contrast. CONTRAST:  138mL ISOVUE-300 IOPAMIDOL (ISOVUE-300) INJECTION 61% COMPARISON:  None. FINDINGS: Lower chest: Lung bases are clear. Hepatobiliary: No focal hepatic lesion. Postcholecystectomy. No biliary dilatation. Pancreas: Pancreas is normal. No ductal dilatation. No pancreatic inflammation. Spleen: Normal spleen Adrenals/urinary tract:  Adrenal glands and kidneys are normal. Small benign cysts of the RIGHT kidney. The ureters and bladder normal. Stomach/Bowel: Stomach, small bowel, appendix, and cecum are normal. The colon and rectosigmoid colon are normal. Vascular/Lymphatic: Abdominal aorta is normal caliber. There is no retroperitoneal or periportal lymphadenopathy. No pelvic lymphadenopathy. Reproductive: Post hysterectomy anatomy.  No adnexal abnormality Other: No free fluid. Musculoskeletal: No aggressive osseous lesion. IMPRESSION: 1. No acute findings in the abdomen pelvis. 2. No abnormality of the bowel identified. 3. Post hysterectomy and cholecystectomy Electronically Signed   By: Suzy Bouchard M.D.   On: 04/22/2017 11:48    Procedures Procedures (including critical care time)  Medications Ordered in ED Medications  sodium chloride 0.9 % bolus 1,000 mL (0 mLs Intravenous Stopped 04/22/17 1352)    Followed by  0.9 %  sodium chloride infusion (1,000 mLs Intravenous New Bag/Given 04/22/17 1355)  iopamidol (ISOVUE-300) 61 % injection 100 mL (100 mLs Intravenous Contrast Given 04/22/17 1114)  pantoprazole (PROTONIX) injection 40 mg (40 mg Intravenous Given 04/22/17 1235)  famotidine (PEPCID) IVPB 20 mg premix (0 mg Intravenous Stopped 04/22/17 1306)     Initial Impression / Assessment and Plan / ED Course  I have reviewed the triage vital signs and the nursing notes.  Pertinent labs & imaging results that were available during my care of the patient were reviewed by me and considered in my medical decision making (see chart for details).    Final Clinical Impressions(s) / ED Diagnoses MDM Vital signs within normal limits. Stool for occult blood is positive. The CBC and competence of metabolic panel shows some low levels of platelets, otherwise mostly within normal limits. The CT scan is negative for acute problem. The patient will be treated with Carafate, Protonix, and Zantac. The patient is referred to Dr. Buford Dresser for  GI evaluation and management. The patient will return to the emergency department sooner if any bleeding, unusual weakness, or other conditions before she is seen by Dr. Buford Dresser. The patient is in agreement with this plan.    Final diagnoses:  Acute GI bleeding    New Prescriptions Discharge Medication List as of 04/22/2017  2:31 PM    START taking these medications   Details  pantoprazole (PROTONIX) 20 MG  tablet Take 1 tablet (20 mg total) by mouth 2 (two) times daily., Starting Thu 04/22/2017, Print    ranitidine (ZANTAC 150 MAXIMUM STRENGTH) 150 MG tablet Take 1 tablet (150 mg total) by mouth 2 (two) times daily., Starting Thu 04/22/2017, Print    sucralfate (CARAFATE) 1 GM/10ML suspension Take 10 mLs (1 g total) by mouth 4 (four) times daily -  with meals and at bedtime., Starting Thu 04/22/2017, Print         Lily Kocher, PA-C 04/22/17 [place], [place], DO 04/27/17 [phone]

## 2017-04-26 ENCOUNTER — Telehealth: Payer: Self-pay | Admitting: Gastroenterology

## 2017-04-26 NOTE — Telephone Encounter (Signed)
A user error has taken place: Error °

## 2017-04-26 NOTE — Telephone Encounter (Signed)
The pt has been advised that no advice can be given to her until she has been seen. She has been scheduled to see Janett Billow on 04/28/17.  She will call her PCP and states the ED gave her the name of another GI she will call that office as well and call back to cancel the appt if they can not see her prior to her appt here

## 2017-04-28 ENCOUNTER — Ambulatory Visit (INDEPENDENT_AMBULATORY_CARE_PROVIDER_SITE_OTHER): Payer: Commercial Managed Care - PPO | Admitting: Physician Assistant

## 2017-04-28 ENCOUNTER — Encounter: Payer: Self-pay | Admitting: Physician Assistant

## 2017-04-28 VITALS — BP 120/76 | HR 68 | Ht 62.0 in | Wt 177.2 lb

## 2017-04-28 DIAGNOSIS — Z91018 Allergy to other foods: Secondary | ICD-10-CM

## 2017-04-28 DIAGNOSIS — K625 Hemorrhage of anus and rectum: Secondary | ICD-10-CM | POA: Diagnosis not present

## 2017-04-28 DIAGNOSIS — R1084 Generalized abdominal pain: Secondary | ICD-10-CM | POA: Diagnosis not present

## 2017-04-28 MED ORDER — NA SULFATE-K SULFATE-MG SULF 17.5-3.13-1.6 GM/177ML PO SOLN: 1.0000 | ORAL | 0 refills | Status: DC

## 2017-04-28 MED ORDER — HYOSCYAMINE SULFATE 0.125 MG SL SUBL: 0.1250 mg | SUBLINGUAL_TABLET | SUBLINGUAL | 1 refills | Status: DC | PRN

## 2017-04-28 NOTE — Patient Instructions (Addendum)
You have been scheduled for a colonoscopy. Please follow written instructions given to you at your visit today.  Please pick up your prep supplies at the pharmacy within the next 1-3 days. If you use inhalers (even only as needed), please bring them with you on the day of your procedure. Your physician has requested that you go to www.startemmi.com and enter the access code given to you at your visit today. This web site gives a general overview about your procedure. However, you should still follow specific instructions given to you by our office regarding your preparation for the procedure.  We will call you with an appointment to an allergy specialist.   We have sent the following medications to your pharmacy for you to pick up at your convenience: Hyoscyamine 0.125 mg every 4-6 hours as needed for abdominal cramping.

## 2017-04-28 NOTE — Progress Notes (Addendum)
Chief Complaint: Abdominal pain, rectal bleeding  HPI:   Autumn Beasley is a 41 year old Caucasian female with a past medical history significant for gastropathy, GERD, hemorrhoids, IBS, internal hemorrhoids, Lyme disease and others listed below, who presents to clinic today for a complaint of abdominal pain and recent rectal bleeding.   Please recall patient was followed in the clinic previously with Dr. Olevia Perches and was last seen 3/9/616 by Alonza Bogus, PA-C. At that time she described complaints of periumbilical abdominal pain that was present for the past 10 days. She had a recent abdominal x-ray on 02/06/15 which showed mildly increased colonic stool burning, then was seen in the ED on 3/3/ 16 for a CT scan which did not show any acute findings or explanantion for her symptoms. CBC and CMP were normal that time. At that time it was discussed that the patient had been complaining of abdominal pain back at time of her colonoscopy in 2013 which showed only internal hemorrhoids. This had been relieved by her bowel prep. At that time patient was started on magnesium citrate and Bentyl.   Per chart review patient was recently seen in the ED on 04/22/17 with a complaint of rectal bleeding. Notes from that visit showed the patient complained of rectal bleeding for at least 2 months and a very large stool that day with a lot of blood in it. After that episode the patient had a lot of weakness and lightheadedness, she was unable to walk or talk for a time and her daughter brought her into the EGD. Patient had labs including a CBC which showed a normal hemoglobin at 15.9, she did have fecal occult blood positive in her stool, CMP was normal, pregnancy test was negative. She had CT the abdomen pelvis with no acute findings in the abdomen or pelvis and no abnormality of the bowel identified. She was post hysterectomy and cholecystectomy. Patient was given Zantac, Protonix and Carafate at that time.   Today the patient  tells me that she has been experiencing intermittent bright red blood in her stool over the past 2 months, possibly 8-10 times which seems to be "more than just by hemorrhoids", as it fills the toilet. Patient describes that last Wednesday night she had a lot of blood in her stool and upon waking on Thursday and taking a shower she felt very weak, hot and sweaty and her daughter found her unable to speak or walk, she had a large amount of diarrhea and proceeded to the ER. Results of that visit were as above. Patient tells me on Friday she had a fever between 100-101 and "I thought I might die", she describes a generalized "cramping/burning" abdominal pain that made it feel like she couldn't breathe. Patient did take one of her daughters what sounds like antispasmodic and had some relief of this pain "otherwise I would have gone back to the ER". Patient notes that she then put herself on a clear liquid diet and this helped ease her pain, she ate and drank very little over the weekend and told me that when she did eat or drink or pain seemed to only be on the left side of her abdomen. Since that time she has been gradually adding solids back into her diet and is currently on her way back to normal diet and has only "lingering discomfort" on the left side of her abdomen. She has returned to having normal bowel movements and has seen no further blood. She did not take the "  stomach meds" prescribed in the ED as she felt they may be making her pain worse.   Patient describes a history of IBS and being on hyoscyamine or dicyclomine as needed for abdominal cramping pain which did help in the past. She does express worry that she may be allergic to certain foods as she tends to attach her symptoms to things that she has eaten, most recently strawberries and before that popcorn.   Patient's family history is significant for a diagnosis of Crohn's in her sister.   Patient denies fever, chills, weight loss, fatigue,  anorexia, vomiting, heartburn, reflux or symptoms that awaken her at night.  Past Medical History:  Diagnosis Date  . Asthma   . Gastropathy   . GERD (gastroesophageal reflux disease)   . Hemorrhoids   . IBS (irritable bowel syndrome)   . Internal hemorrhoids   . Lyme disease   . Migraine     Past Surgical History:  Procedure Laterality Date  . ABDOMINAL HYSTERECTOMY    . CHOLECYSTECTOMY    . TONSILLECTOMY      Current Outpatient Prescriptions  Medication Sig Dispense Refill  . calcium carbonate (TUMS - DOSED IN MG ELEMENTAL CALCIUM) 500 MG chewable tablet Chew 1 tablet by mouth daily.    Marland Kitchen NALTREXONE HCL PO Take 4.15 mg by mouth at bedtime.     Marland Kitchen oxymetazoline (AFRIN) 0.05 % nasal spray Place 1 spray into both nostrils 2 (two) times daily as needed for congestion.    . topiramate (TOPAMAX) 100 MG tablet Take 100 mg by mouth daily.     . hyoscyamine (LEVSIN SL) 0.125 MG SL tablet Place 1 tablet (0.125 mg total) under the tongue every 4 (four) hours as needed. 60 tablet 1  . Ibuprofen 200 MG CAPS Take 400 mg by mouth every 6 (six) hours as needed for pain.    . Na Sulfate-K Sulfate-Mg Sulf (SUPREP BOWEL PREP KIT) 17.5-3.13-1.6 GM/180ML SOLN Take 1 kit by mouth as directed. 324 mL 0  . pantoprazole (PROTONIX) 20 MG tablet Take 1 tablet (20 mg total) by mouth 2 (two) times daily. (Patient not taking: Reported on 04/28/2017) 30 tablet 0  . ranitidine (ZANTAC 150 MAXIMUM STRENGTH) 150 MG tablet Take 1 tablet (150 mg total) by mouth 2 (two) times daily. (Patient not taking: Reported on 04/28/2017) 30 tablet 0  . sucralfate (CARAFATE) 1 GM/10ML suspension Take 10 mLs (1 g total) by mouth 4 (four) times daily -  with meals and at bedtime. (Patient not taking: Reported on 04/28/2017) 420 mL 0   No current facility-administered medications for this visit.     Allergies as of 04/28/2017 - Review Complete 04/28/2017  Allergen Reaction Noted  . Morphine and related  11/04/2011  . Penicillins  Rash 11/04/2011    Family History  Problem Relation Age of Onset  . Diabetes Maternal Aunt   . Diabetes Maternal Grandmother   . Alcohol abuse Maternal Grandfather   . Crohn's disease Sister   . Liver disease Maternal Aunt   . COPD Maternal Aunt   . Colon cancer Neg Hx   . Rectal cancer Neg Hx   . Stomach cancer Neg Hx     Social History   Social History  . Marital status: Married    Spouse name: N/A  . Number of children:  2  . Years of education: N/A   Occupational History  .  [employer]   Social History Main Topics  . Smoking status: Never Smoker  .  Smokeless tobacco: Never Used  . Alcohol use No  . Drug use: No  . Sexual activity: Not on file   Other Topics Concern  . Not on file   Social History Narrative  . No narrative on file    Review of Systems:    Constitutional: No weight loss or chills Skin: No rash  Cardiovascular: No chest pain Respiratory: No SOB  Gastrointestinal: See HPI and otherwise negative   Physical Exam:  Vital signs: BP 120/76 (BP Location: Left Arm, Patient Position: Sitting, Cuff Size: Normal)   Pulse 68   Ht '5\' 2"'  (1.575 m) Comment: height measured without shoes  Wt 177 lb 4 oz (80.4 kg)   BMI 32.42 kg/m   Constitutional:   Pleasant Caucasian female appears to be in NAD, Well developed, Well nourished, alert and cooperative Head:  Normocephalic and atraumatic. Eyes:   PEERL, EOMI. No icterus. Conjunctiva pink. Ears:  Normal auditory acuity. Neck:  Supple Throat: Oral cavity and pharynx without inflammation, swelling or lesion.  Respiratory: Respirations even and unlabored. Lungs clear to auscultation bilaterally.   No wheezes, crackles, or rhonchi.  Cardiovascular: Normal S1, S2. No MRG. Regular rate and rhythm. No peripheral edema, cyanosis or pallor.  Gastrointestinal:  Soft, nondistended, mild llq ttp, No rebound or guarding. Normal bowel sounds. No appreciable masses or hepatomegaly. Rectal:  Not performed.  Msk:   Symmetrical without gross deformities. Without edema, no deformity or joint abnormality.  Neurologic:  Alert and  oriented x4;  grossly normal neurologically.  Skin:   Dry and intact without significant lesions or rashes. Psychiatric:  Demonstrates good judgement and reason without abnormal affect or behaviors.  MOST RECENT LABS AND IMAGING: CBC    Component Value Date/Time   WBC 7.6 04/22/2017 0915   RBC 5.22 (H) 04/22/2017 0915   HGB 15.9 (H) 04/22/2017 0915   HCT 45.5 04/22/2017 0915   PLT 138 (L) 04/22/2017 0915   MCV 87.2 04/22/2017 0915   MCH 30.5 04/22/2017 0915   MCHC 34.9 04/22/2017 0915   RDW 13.2 04/22/2017 0915    CMP     Component Value Date/Time   NA 136 04/22/2017 0915   K 4.2 04/22/2017 0915   CL 107 04/22/2017 0915   CO2 23 04/22/2017 0915   GLUCOSE 81 04/22/2017 0915   BUN 17 04/22/2017 0915   CREATININE 0.88 04/22/2017 0915   CALCIUM 9.2 04/22/2017 0915   PROT 7.4 04/22/2017 0915   ALBUMIN 4.2 04/22/2017 0915   AST 40 04/22/2017 0915   ALT 32 04/22/2017 0915   ALKPHOS 55 04/22/2017 0915   BILITOT 0.7 04/22/2017 0915   GFRNONAA >60 04/22/2017 0915   GFRAA >60 04/22/2017 0915   Ct Abdomen Pelvis W Contrast 04/22/17  Result Date: 04/22/2017 CLINICAL DATA:  Heme+stools x weeks per pt., today n/v/d with diaphoresis, hx of ibs, hyster. And chole., EXAM: CT ABDOMEN AND PELVIS WITH CONTRAST TECHNIQUE: Multidetector CT imaging of the abdomen and pelvis was performed using the standard protocol following bolus administration of intravenous contrast. CONTRAST:  121m ISOVUE-300 IOPAMIDOL (ISOVUE-300) INJECTION 61% COMPARISON:  None. FINDINGS: Lower chest: Lung bases are clear. Hepatobiliary: No focal hepatic lesion. Postcholecystectomy. No biliary dilatation. Pancreas: Pancreas is normal. No ductal dilatation. No pancreatic inflammation. Spleen: Normal spleen Adrenals/urinary tract: Adrenal glands and kidneys are normal. Small benign cysts of the RIGHT kidney. The  ureters and bladder normal. Stomach/Bowel: Stomach, small bowel, appendix, and cecum are normal. The colon and rectosigmoid colon are normal. Vascular/Lymphatic: Abdominal aorta  is normal caliber. There is no retroperitoneal or periportal lymphadenopathy. No pelvic lymphadenopathy. Reproductive: Post hysterectomy anatomy.  No adnexal abnormality Other: No free fluid. Musculoskeletal: No aggressive osseous lesion. IMPRESSION: 1. No acute findings in the abdomen pelvis. 2. No abnormality of the bowel identified. 3. Post hysterectomy and cholecystectomy Electronically Signed   By: Suzy Bouchard M.D.   On: 04/22/2017 11:48    Assessment: 1. Abdominal pain: Patient complains of generalized abdominal pain, she thinks related to strawberries/her diet, relieved itself with what sounds like an antispasmodic after about 4 days, history of the same in 2014 and 2016 and relieved with antispasmodics/bowel prep, colonoscopy 2014 only showed internal hemorrhoids, no evaluation since; consider IBS versus IBD versus other 2. Rectal bleeding: Patient has history of rectal bleeding related to internal hemorrhoids in 2014, over the past 2 months patient started with bleeding again but in an increased amount associated with abdominal pain at times, family history of Crohn's in her sister ; consider internal hemorrhoids versus IBS versus AVM versus IBD versus other   Plan: 1. Patient has not been evaluated for rectal bleeding since 2014, now with increased frequency and amount as well as abdominal pain recommend a repeat colonoscopy. Discussed risks, benefits, limitations and alternatives and the patient agrees to proceed. This was scheduled with Dr. Hilarie Fredrickson as he is supervising, in the [facility] 2. Referred patient to an allergist to discuss possible food allergies. 3. Prescribed Hyoscyamine sulfate 0.125 mg every 4-6 hours as needed for abdominal cramping #60 with one refill 4. Patient to follow in clinic per Dr. Vena Rua  recommendations after time of procedure. 5. We did discuss ER return policy including increased abdominal pain, severe bleeding, weakness, dizziness or syncope  Ellouise Newer, PA-C [facility] 04/28/2017, 10:16 AM  Cc: Glenda Chroman, MD   Addendum: Reviewed and agree with initial management. Pyrtle, Lajuan Lines, MD

## 2017-06-11 ENCOUNTER — Encounter: Payer: Commercial Managed Care - PPO | Admitting: Internal Medicine

## 2017-10-27 ENCOUNTER — Telehealth: Payer: Self-pay | Admitting: *Deleted

## 2017-10-27 NOTE — Telephone Encounter (Signed)
Patient states she is having pain with urination, pressure and burning. Also having vaginal itching.  She is has tried AZO with no relief. Patient would like an antibiotic and diflucan if possible. Please advise.

## 2017-10-28 ENCOUNTER — Other Ambulatory Visit: Payer: Self-pay | Admitting: Obstetrics & Gynecology

## 2017-10-28 MED ORDER — SULFAMETHOXAZOLE-TRIMETHOPRIM 800-160 MG PO TABS: 1.0000 | ORAL_TABLET | Freq: Two times a day (BID) | ORAL | 0 refills | Status: DC

## 2017-10-28 MED ORDER — FLUCONAZOLE 150 MG PO TABS: 150.0000 mg | ORAL_TABLET | Freq: Once | ORAL | 0 refills | Status: AC

## 2017-11-02 ENCOUNTER — Encounter (INDEPENDENT_AMBULATORY_CARE_PROVIDER_SITE_OTHER): Payer: Self-pay

## 2017-11-02 ENCOUNTER — Encounter: Payer: Self-pay | Admitting: Physician Assistant

## 2017-11-02 ENCOUNTER — Ambulatory Visit: Payer: BLUE CROSS/BLUE SHIELD | Admitting: Physician Assistant

## 2017-11-02 VITALS — BP 108/80 | HR 68 | Ht 63.0 in | Wt 186.4 lb

## 2017-11-02 DIAGNOSIS — R1030 Lower abdominal pain, unspecified: Secondary | ICD-10-CM

## 2017-11-02 DIAGNOSIS — K625 Hemorrhage of anus and rectum: Secondary | ICD-10-CM | POA: Diagnosis not present

## 2017-11-02 MED ORDER — NA SULFATE-K SULFATE-MG SULF 17.5-3.13-1.6 GM/177ML PO SOLN: 1.0000 | ORAL | 0 refills | Status: DC

## 2017-11-02 NOTE — Patient Instructions (Signed)

## 2017-11-02 NOTE — Progress Notes (Addendum)
Chief Complaint: Abdominal pain, rectal bleeding  HPI:    Autumn Beasley is a 41 year old Caucasian female with a past medical history significant for gastropathy, GERD, hemorrhoids, IBS, internal hemorrhoids and others listed below who was assigned to Dr. Hilarie Fredrickson at her last visit and returns to clinic today for follow-up/continued abdominal pain and rectal bleeding.    Please recall patient was last seen in clinic 04/28/17.  At that time, patient had recently been in the ER 04/22/17 with rectal bleeding.  She had labs that showed a normal hemoglobin of 15.9.  CT of the abdomen and pelvis with no acute findings in the abdomen or pelvis and abnormality of the bowel and in at that time patient described experiencing intermittent bright red blood in her stool over the past 2 months, possibly 8-10 times which seemed like "more than just hemorrhoids".  Patient described her episode that sent her to the ER and told me that she was in the shower and started feeling hot and sweaty and weak and was unable to speak.  She had a large amount of diarrhea and proceeded to the ER.  She described a history of IBS and being on hyoscyamine for cramping in the past.  She did describe a family history significant for diagnosis of Crohn's in her sister.  At that time patient was scheduled for a colonoscopy with Dr. Hilarie Fredrickson.  She was also prescribed hyoscyamine sulfate 0.125 mg every 4-6 hours as needed.    Today, the patient presents to clinic and tells me that she was scheduled for a colonoscopy but did not follow through with this as she began feeling better.  Patient tells me that overall she has continued with rectal bleeding which is intermittent and bright red in her bowel movements.  She describes that typically eating will send her running to the bathroom about 30 minutes later.  Patient tells me that about 2 weeks ago she had another "severe episode of lower abdominal cramping pain".  She tells me she was doubled over and  "shaking in pain".  She tells me that this was so bad that she "could not make it to the ER".  Patient tells me that when this happens it feels as though there is a blockage and something has to "work through" and then she will defecate and feels some better.  This is exactly what happened.  Typically it is a loose stool.  Patient tells me that she has lingering generalized abdominal discomfort, but this is not abnormal.  She does use her hyoscyamine if she "feels an episode coming on", and it will "take the edge off".    Patient also describes suffering from recurrent yeast infections and wonders if this is related.    Patient denies fever, chills, weight loss, anorexia, nausea, vomiting or symptoms that awaken her at night.   Past Medical History:  Diagnosis Date  . Asthma   . Gastropathy   . GERD (gastroesophageal reflux disease)   . Hemorrhoids   . IBS (irritable bowel syndrome)   . Internal hemorrhoids   . Lyme disease   . Migraine     Past Surgical History:  Procedure Laterality Date  . ABDOMINAL HYSTERECTOMY    . CHOLECYSTECTOMY    . TONSILLECTOMY      Current Outpatient Medications  Medication Sig Dispense Refill  . calcium carbonate (TUMS - DOSED IN MG ELEMENTAL CALCIUM) 500 MG chewable tablet Chew 1 tablet by mouth daily.    . hyoscyamine (LEVSIN SL)  0.125 MG SL tablet Place 1 tablet (0.125 mg total) under the tongue every 4 (four) hours as needed. 60 tablet 1  . Ibuprofen 200 MG CAPS Take 400 mg by mouth every 6 (six) hours as needed for pain.    Marland Kitchen NALTREXONE HCL PO Take 4.15 mg by mouth at bedtime.     . topiramate (TOPAMAX) 100 MG tablet Take 100 mg by mouth daily.      No current facility-administered medications for this visit.     Allergies as of 11/02/2017 - Review Complete 11/02/2017  Allergen Reaction Noted  . Morphine and related  11/04/2011  . Penicillins Rash 11/04/2011    Family History  Problem Relation Age of Onset  . Diabetes Maternal Aunt   .  Diabetes Maternal Grandmother   . Alcohol abuse Maternal Grandfather   . Crohn's disease Sister   . Liver disease Maternal Aunt   . COPD Maternal Aunt   . Colon cancer Neg Hx   . Rectal cancer Neg Hx   . Stomach cancer Neg Hx     Social History   Socioeconomic History  . Marital status: Married    Spouse name: Not on file  . Number of children:  2  . Years of education: Not on file  . Highest education level: Not on file  Social Needs  . Financial resource strain: Not on file  . Food insecurity - worry: Not on file  . Food insecurity - inability: Not on file  . Transportation needs - medical: Not on file  . Transportation needs - non-medical: Not on file  Occupational History    Employer: [employer]  Tobacco Use  . Smoking status: Never Smoker  . Smokeless tobacco: Never Used  Substance and Sexual Activity  . Alcohol use: No  . Drug use: No  . Sexual activity: Not on file  Other Topics Concern  . Not on file  Social History Narrative  . Not on file    Review of Systems:    Constitutional: No weight loss, fever or chills Cardiovascular: No chest pain Respiratory: No SOB Gastrointestinal: See HPI and otherwise negative   Physical Exam:  Vital signs: BP 108/80   Pulse 68   Ht 5\' 3"  (1.6 m)   Wt 186 lb 6 oz (84.5 kg)   BMI 33.01 kg/m   Constitutional:   Pleasant overweight Caucasian female appears to be in NAD, Well developed, Well nourished, alert and cooperative Respiratory: Respirations even and unlabored. Lungs clear to auscultation bilaterally.   No wheezes, crackles, or rhonchi.  Cardiovascular: Normal S1, S2. No MRG. Regular rate and rhythm. No peripheral edema, cyanosis or pallor.  Gastrointestinal:  Soft, nondistended, mild generalized ttp. No rebound or guarding. Normal bowel sounds. No appreciable masses or hepatomegaly. Psychiatric: Demonstrates good judgement and reason without abnormal affect or behaviors.  RELEVANT LABS AND IMAGING: CBC    Component  Value Date/Time   WBC 7.6 04/22/2017 0915   RBC 5.22 (H) 04/22/2017 0915   HGB 15.9 (H) 04/22/2017 0915   HCT 45.5 04/22/2017 0915   PLT 138 (L) 04/22/2017 0915   MCV 87.2 04/22/2017 0915   MCH 30.5 04/22/2017 0915   MCHC 34.9 04/22/2017 0915   RDW 13.2 04/22/2017 0915    CMP     Component Value Date/Time   NA 136 04/22/2017 0915   K 4.2 04/22/2017 0915   CL 107 04/22/2017 0915   CO2 23 04/22/2017 0915   GLUCOSE 81 04/22/2017 0915   BUN  17 04/22/2017 0915   CREATININE 0.88 04/22/2017 0915   CALCIUM 9.2 04/22/2017 0915   PROT 7.4 04/22/2017 0915   ALBUMIN 4.2 04/22/2017 0915   AST 40 04/22/2017 0915   ALT 32 04/22/2017 0915   ALKPHOS 55 04/22/2017 0915   BILITOT 0.7 04/22/2017 0915   GFRNONAA >60 04/22/2017 0915   GFRAA >60 04/22/2017 0915    Assessment: 1.  Abdominal pain: Again patient complains of generalized abdominal pain, typically worse in the lower abdomen, severe episode which had her doubled over and resulted in loose bowel movement about 2 weeks ago, continues with some hematochezia, colonoscopy 2014 showed only internal hemorrhoids, CT for the symptoms in May was normal; again consider most likely IBS but with family history of IBD would like to rule this out, could also consider mesenteric ischemia? 2.  Rectal bleeding: Patient has a history of rectal bleeding related to internal hemorrhoids in 2014, over the past 6 months patient has had intermittent rectal bleeding ever since, sometimes this is worse than others; most likely internal hemorrhoids should consider AVM versus IBD  Plan: 1.  Scheduled patient for colonoscopy with Dr. Hilarie Fredrickson in the [facility].  Did discuss risk, benefits, limitations and alternatives the patient agrees to proceed. 2.  Continue hyoscyamine sulfate 0.125 mg every 4-6 hours as needed for abdominal cramping 3.  Patient to follow in clinic per Dr. Vena Rua recommendations after time of procedure.  Ellouise Newer, PA-C [facility] 11/02/2017, 2:06 PM  Cc: Glenda Chroman, MD   Addendum: Reviewed and agree with initial management. Pyrtle, Lajuan Lines, MD

## 2017-11-04 ENCOUNTER — Other Ambulatory Visit: Payer: Self-pay

## 2017-11-04 ENCOUNTER — Encounter: Payer: Self-pay | Admitting: Internal Medicine

## 2017-11-04 ENCOUNTER — Ambulatory Visit (AMBULATORY_SURGERY_CENTER): Payer: BLUE CROSS/BLUE SHIELD | Admitting: Internal Medicine

## 2017-11-04 VITALS — BP 121/68 | HR 64 | Temp 98.6°F | Resp 10 | Ht 63.0 in | Wt 186.0 lb

## 2017-11-04 DIAGNOSIS — R1030 Lower abdominal pain, unspecified: Secondary | ICD-10-CM | POA: Diagnosis not present

## 2017-11-04 DIAGNOSIS — R194 Change in bowel habit: Secondary | ICD-10-CM

## 2017-11-04 DIAGNOSIS — K921 Melena: Secondary | ICD-10-CM | POA: Diagnosis not present

## 2017-11-04 DIAGNOSIS — D123 Benign neoplasm of transverse colon: Secondary | ICD-10-CM

## 2017-11-04 MED ORDER — SODIUM CHLORIDE 0.9 % IV SOLN: 500.0000 mL | INTRAVENOUS | Status: DC

## 2017-11-04 MED ORDER — RIFAXIMIN 550 MG PO TABS: 550.0000 mg | ORAL_TABLET | Freq: Three times a day (TID) | ORAL | 0 refills | Status: AC

## 2017-11-04 NOTE — Op Note (Signed)
[facility] Patient Name: Autumn Beasley Procedure Date: 11/04/2017 11:10 AM MRN: [id number] Endoscopist: Jerene Bears , MD Age: 41 Referring MD:  Date of Birth: 1976-10-18 Gender: Female Account #: [id number] Procedure:                Colonoscopy Indications:              Lower abdominal pain with intermittent loose                            stools, Rectal bleeding Medicines:                Monitored Anesthesia Care Procedure:                Pre-Anesthesia Assessment:                           - Prior to the procedure, a History and Physical                            was performed, and patient medications and                            allergies were reviewed. The patient's tolerance of                            previous anesthesia was also reviewed. The risks                            and benefits of the procedure and the sedation                            options and risks were discussed with the patient.                            All questions were answered, and informed consent                            was obtained. Prior Anticoagulants: The patient has                            taken no previous anticoagulant or antiplatelet                            agents. ASA Grade Assessment: II - A patient with                            mild systemic disease. After reviewing the risks                            and benefits, the patient was deemed in                            satisfactory condition to undergo the procedure.  After obtaining informed consent, the colonoscope                            was passed under direct vision. Throughout the                            procedure, the patient's blood pressure, pulse, and                            oxygen saturations were monitored continuously. The                            Colonoscope was introduced through the anus and                            advanced to the the terminal ileum. The  colonoscopy                            was performed without difficulty. The patient                            tolerated the procedure well. The quality of the                            bowel preparation was excellent. The terminal                            ileum, ileocecal valve, appendiceal orifice, and                            rectum were photographed. Scope In: 11:26:28 AM Scope Out: 11:41:15 AM Scope Withdrawal Time: 0 hours 12 minutes 12 seconds  Total Procedure Duration: 0 hours 14 minutes 47 seconds  Findings:                 The digital rectal exam was normal.                           The terminal ileum appeared normal.                           A 8 mm polyp was found in the transverse colon. The                            polyp was flat. The polyp was removed with a cold                            snare. Resection and retrieval were complete.                           External and internal hemorrhoids were found during                            retroflexion. The hemorrhoids were small.  The exam was otherwise normal throughout the                            examined colon.                           Biopsies for histology were taken with a cold                            forceps from the right colon and left colon for                            evaluation of microscopic colitis. Complications:            No immediate complications. Estimated Blood Loss:     Estimated blood loss was minimal. Impression:               - The examined portion of the ileum was normal.                           - One 8 mm polyp in the transverse colon, removed                            with a cold snare. Resected and retrieved.                           - Small external and internal hemorrhoids.                           - Biopsies were taken with a cold forceps from the                            right colon and left colon for evaluation of                             microscopic colitis. Recommendation:           - Patient has a contact number available for                            emergencies. The signs and symptoms of potential                            delayed complications were discussed with the                            patient. Return to normal activities tomorrow.                            Written discharge instructions were provided to the                            patient.                           - Resume  previous diet.                           - Continue present medications.                           - Await pathology results.                           - Repeat colonoscopy is recommended. The                            colonoscopy date will be determined after pathology                            results from today's exam become available for                            review.                           - Trial of rifaximin 550 mg TID x 14 days for                            IBS-D. If no improvement recommend trial of                            bile-acid sequestrant.                           - Office follow-up next available. Jerene Bears, MD 11/04/2017 11:48:00 AM This report has been signed electronically.

## 2017-11-04 NOTE — Progress Notes (Signed)
Pt's states no medical or surgical changes since previsit or office visit.  No egg or soy allergy per pt.

## 2017-11-04 NOTE — Progress Notes (Signed)
Report given to PACU, vss 

## 2017-11-04 NOTE — Patient Instructions (Signed)
HANDOUT GIVEN : POLYPS AND HEMORRHOIDS   YOU HAD AN ENDOSCOPIC PROCEDURE TODAY AT THE [facility]:   Refer to the procedure report that was given to you for any specific questions about what was found during the examination.  If the procedure report does not answer your questions, please call your gastroenterologist to clarify.  If you requested that your care partner not be given the details of your procedure findings, then the procedure report has been included in a sealed envelope for you to review at your convenience later.  YOU SHOULD EXPECT: Some feelings of bloating in the abdomen. Passage of more gas than usual.  Walking can help get rid of the air that was put into your GI tract during the procedure and reduce the bloating. If you had a lower endoscopy (such as a colonoscopy or flexible sigmoidoscopy) you may notice spotting of blood in your stool or on the toilet paper. If you underwent a bowel prep for your procedure, you may not have a normal bowel movement for a few days.  Please Note:  You might notice some irritation and congestion in your nose or some drainage.  This is from the oxygen used during your procedure.  There is no need for concern and it should clear up in a day or so.  SYMPTOMS TO REPORT IMMEDIATELY:   Following lower endoscopy (colonoscopy or flexible sigmoidoscopy):  Excessive amounts of blood in the stool  Significant tenderness or worsening of abdominal pains  Swelling of the abdomen that is new, acute  Fever of 100F or higher   For urgent or emergent issues, a gastroenterologist can be reached at any hour by calling [phone].   DIET:  We do recommend a small meal at first, but then you may proceed to your regular diet.  Drink plenty of fluids but you should avoid alcoholic beverages for 24 hours.  ACTIVITY:  You should plan to take it easy for the rest of today and you should NOT DRIVE or use heavy machinery until tomorrow (because of the  sedation medicines used during the test).    FOLLOW UP: Our staff will call the number listed on your records the next business day following your procedure to check on you and address any questions or concerns that you may have regarding the information given to you following your procedure. If we do not reach you, we will leave a message.  However, if you are feeling well and you are not experiencing any problems, there is no need to return our call.  We will assume that you have returned to your regular daily activities without incident.  If any biopsies were taken you will be contacted by phone or by letter within the next 1-3 weeks.  Please call us at [phone] if you have not heard about the biopsies in 3 weeks.    SIGNATURES/CONFIDENTIALITY: You and/or your care partner have signed paperwork which will be entered into your electronic medical record.  These signatures attest to the fact that that the information above on your After Visit Summary has been reviewed and is understood.  Full responsibility of the confidentiality of this discharge information lies with you and/or your care-partner. 

## 2017-11-04 NOTE — Progress Notes (Signed)
Called to room to assist during endoscopic procedure.  Patient ID and intended procedure confirmed with present staff. Received instructions for my participation in the procedure from the performing physician.  

## 2017-11-05 ENCOUNTER — Telehealth: Payer: Self-pay | Admitting: *Deleted

## 2017-11-05 NOTE — Telephone Encounter (Signed)
  Follow up Call-  Call back number 11/04/2017  Post procedure Call Back phone  # [phone]  Permission to leave phone message Yes  Some recent data might be hidden     Patient questions:  Do you have a fever, pain , or abdominal swelling? No. Pain Score  0 *  Have you tolerated food without any problems? Yes.    Have you been able to return to your normal activities? Yes.    Do you have any questions about your discharge instructions: Diet   No. Medications  No. Follow up visit  No.  Do you have questions or concerns about your Care? No.  Actions: * If pain score is 4 or above: No action needed, pain <4.

## 2017-11-09 ENCOUNTER — Telehealth: Payer: Self-pay | Admitting: *Deleted

## 2017-11-09 NOTE — Telephone Encounter (Signed)
[employer] has approved Xifaxan 550 mg through 11/22/17. Reference # [id number]

## 2017-11-11 ENCOUNTER — Encounter: Payer: Self-pay | Admitting: Internal Medicine

## 2017-11-26 ENCOUNTER — Telehealth: Payer: Self-pay | Admitting: Internal Medicine

## 2017-11-26 NOTE — Telephone Encounter (Signed)
Insurance has extended approval through today. Pharmacy has run medication back through and medication has gone through for $0.00 copay. Patient advised.

## 2017-12-20 ENCOUNTER — Encounter: Payer: Self-pay | Admitting: *Deleted

## 2017-12-31 ENCOUNTER — Ambulatory Visit: Payer: BLUE CROSS/BLUE SHIELD | Admitting: Internal Medicine

## 2017-12-31 ENCOUNTER — Encounter: Payer: Self-pay | Admitting: Internal Medicine

## 2017-12-31 VITALS — BP 104/66 | HR 62 | Ht 63.0 in | Wt 192.0 lb

## 2017-12-31 DIAGNOSIS — K219 Gastro-esophageal reflux disease without esophagitis: Secondary | ICD-10-CM

## 2017-12-31 DIAGNOSIS — K580 Irritable bowel syndrome with diarrhea: Secondary | ICD-10-CM | POA: Diagnosis not present

## 2017-12-31 DIAGNOSIS — R1030 Lower abdominal pain, unspecified: Secondary | ICD-10-CM | POA: Diagnosis not present

## 2017-12-31 DIAGNOSIS — Z86010 Personal history of colonic polyps: Secondary | ICD-10-CM | POA: Diagnosis not present

## 2017-12-31 DIAGNOSIS — K648 Other hemorrhoids: Secondary | ICD-10-CM

## 2017-12-31 MED ORDER — HYOSCYAMINE SULFATE 0.125 MG SL SUBL: 0.1250 mg | SUBLINGUAL_TABLET | SUBLINGUAL | 3 refills | Status: DC | PRN

## 2017-12-31 NOTE — Progress Notes (Signed)
Subjective:    Patient ID: Autumn Beasley, female    DOB: [birth year], 42 y.o.   MRN: [id number]  HPI Autumn Beasley is 42 yo female with PMH of IBS, hemorrhoids and GERD who is here for follow-up.  She was seen by Ellouise Newer, PA-C on 11/02/2017 to evaluate abdominal pain in the lower abdomen with episodes of loose stools.  She was also having rectal bleeding.  She came for a colonoscopy which was performed on 11/04/2017.  This showed a normal terminal ileum.  An 8 mm transverse colon polyp which was removed and found to be a sessile serrated polyp without high-grade dysplasia.  There were internal and external hemorrhoids found on retroflexion.  Random biopsies were negative for microscopic colitis  After pathology results were reviewed she was given rifaximin 550 mg 3 times daily times 14 days.  She states this was a "miracle" drug for her.  She has had no further attacks of abdominal pain.  Her bowel habits have normalized.  Only on 2 occasions since colonoscopy she has had some minor, scant blood with wiping and on the stool which she has attributed to hemorrhoids.  She is only needed to use Levsin one time for some lower abdominal crampy discomfort.  Levsin helped tremendously.  She is very happy with the result of this therapy.  Review of Systems As per HPI, otherwise negative  Current Medications, Allergies, Past Medical History, Past Surgical History, Family History and Social History were reviewed in [employer] record.     Objective:   Physical Exam BP 104/66   Pulse 62   Ht 5\' 3"  (1.6 m)   Wt 192 lb (87.1 kg)   BMI 34.01 kg/m  Constitutional: Well-developed and well-nourished. No distress. HEENT: Normocephalic and atraumatic. Conjunctivae are normal.  No scleral icterus. Neck: Neck supple. Trachea midline. Cardiovascular: Normal rate, regular rhythm and intact distal pulses. No M/R/G Pulmonary/chest: Effort normal and breath sounds normal. No  wheezing, rales or rhonchi. Abdominal: Soft, nontender, nondistended. Bowel sounds active throughout.  Extremities: no clubbing, cyanosis, or edema Neurological: Alert and oriented to person place and time. Skin: Skin is warm and dry. Psychiatric: Normal mood and affect. Behavior is normal.     Assessment & Plan:  42 yo female with PMH of IBS, hemorrhoids and GERD who is here for follow-up.  She was seen by Ellouise Newer, PA-C on 11/02/2017 to evaluate abdominal pain in the lower abdomen with episodes of loose stools.    1.  IBS with diarrhea and abdominal pain --excellent response to rifaximin.  She is very pleased with her improvement.  We discussed how she may require retreatment in the future if symptoms recur but until then she can use Levsin 0.125 mg 1-2 tablets sublingual every 6 hours as needed.  2.  Sessile serrated polyp --8 mm.  We will repeat in 3 years for surveillance based on her young age and this polyp histology.  We discussed this recommendation and her recall colonoscopy will be November 2021  3.  Internal hemorrhoids --small with very intermittent bleeding.  Improved with normalization of bowel movements.  Can use over-the-counter Preparation H suppositories per box instruction as needed for symptoms.  If internal hemorrhoids worsen or bleed more frequently we discussed hemorrhoidal banding.  She was given a brochure today and will contact me if symptoms worsen.  Followup as needed   25 minutes spent with the patient today. Greater than 50% was spent in counseling and coordination  of care with the patient

## 2017-12-31 NOTE — Patient Instructions (Signed)
We have sent the following medications to your pharmacy for you to pick up at your convenience: Levsin  Please follow up with Dr Hilarie Fredrickson as needed.  If you are age 41 or older, your body mass index should be between 23-30. Your Body mass index is 34.01 kg/m. If this is out of the aforementioned range listed, please consider follow up with your Primary Care Provider.  If you are age 60 or younger, your body mass index should be between 19-25. Your Body mass index is 34.01 kg/m. If this is out of the aformentioned range listed, please consider follow up with your Primary Care Provider.

## 2018-02-14 ENCOUNTER — Ambulatory Visit: Payer: Commercial Managed Care - PPO | Admitting: Obstetrics & Gynecology

## 2018-07-13 IMAGING — CT CT ABD-PELV W/ CM
2 of 4 series · 17 of 46 positions shown, 19 images · IV contrast (Isovue)
Comparison: None.

CLINICAL DATA: Heme+stools x weeks per pt., today n/v/d with
diaphoresis, hx of ibs, hyster. And chole.,

EXAM:
CT ABDOMEN AND PELVIS WITH CONTRAST
TECHNIQUE: Multidetector CT imaging of the abdomen and pelvis was performed
using the standard protocol following bolus administration of
intravenous contrast.
CONTRAST:  100mL PO6OT7-XLL IOPAMIDOL (PO6OT7-XLL) INJECTION 61%

[Series 2: axial st · axial · 0.74mm/px · z∈[-409,-29]mm · 14 of 84 slices shown, 16 images]
[im 4/84  soft-tissue]
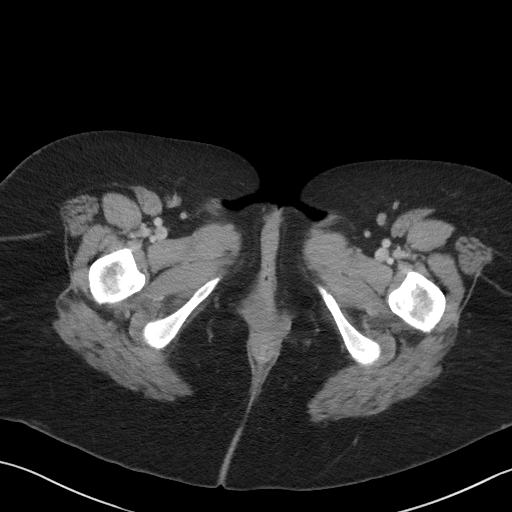
[im 4/84  bone]
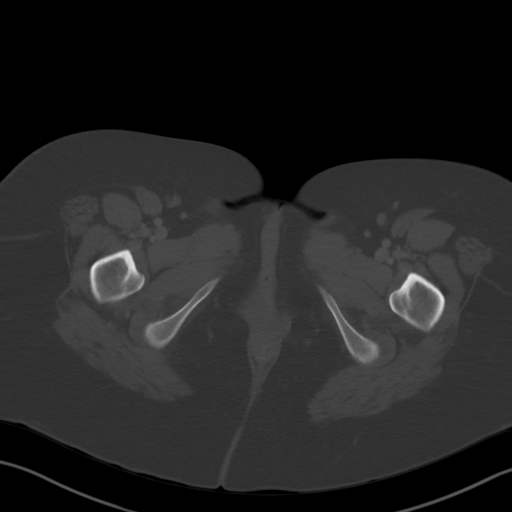
[im 10/84  soft-tissue]
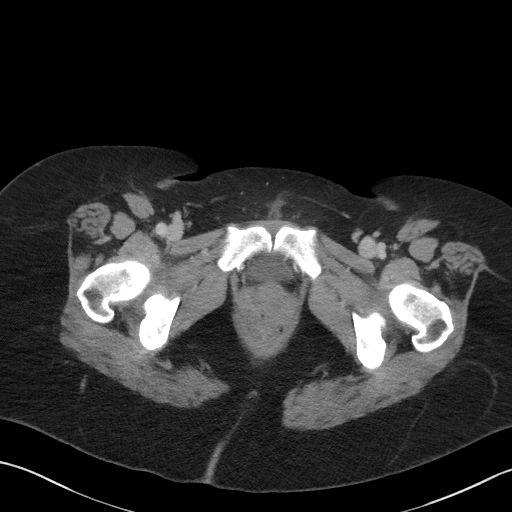
[im 17/84  soft-tissue]
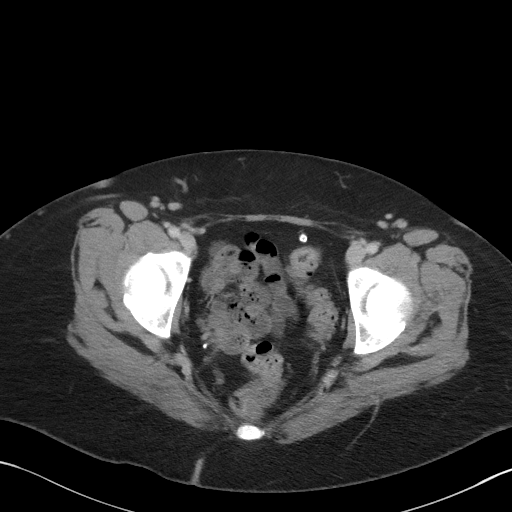
[im 24/84  soft-tissue]
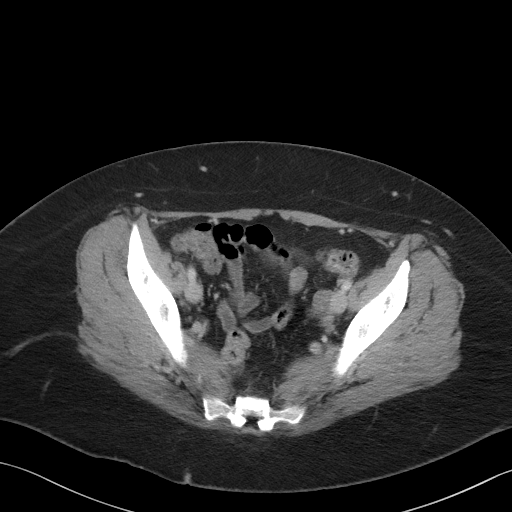
[im 27/84  soft-tissue]
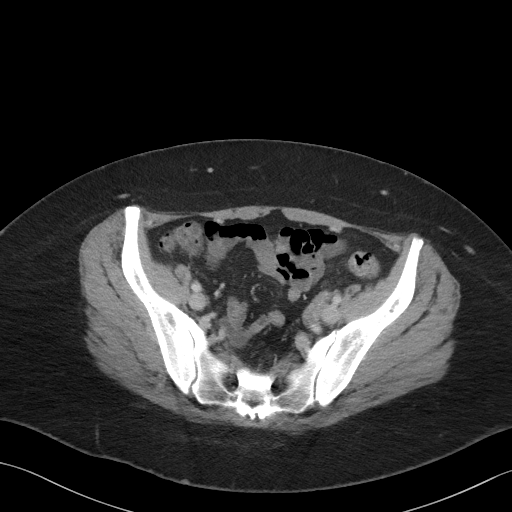
[im 34/84  soft-tissue]
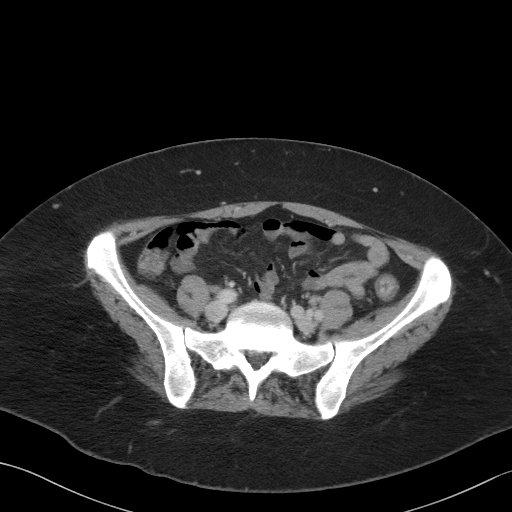
[im 40/84  soft-tissue]
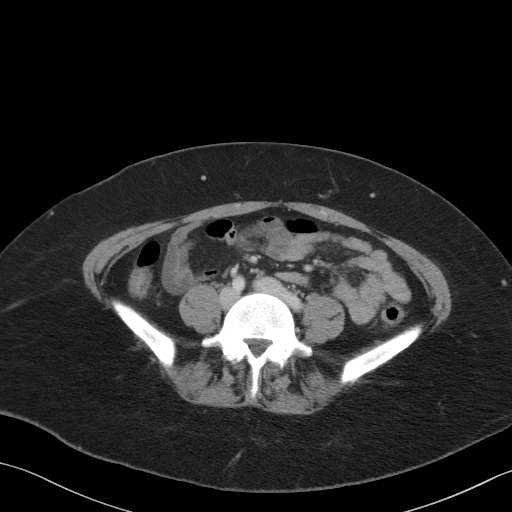
[im 44/84  soft-tissue]
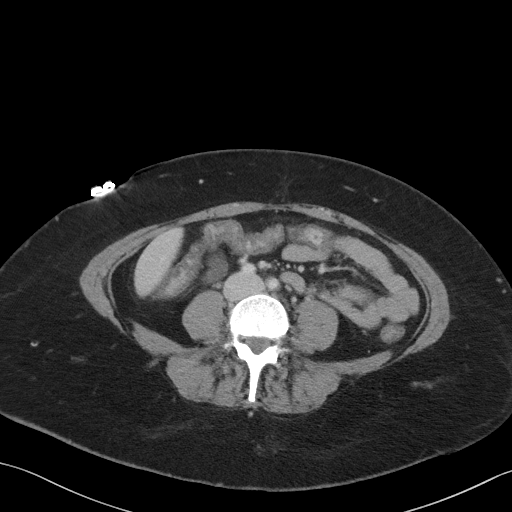
[im 50/84  soft-tissue]
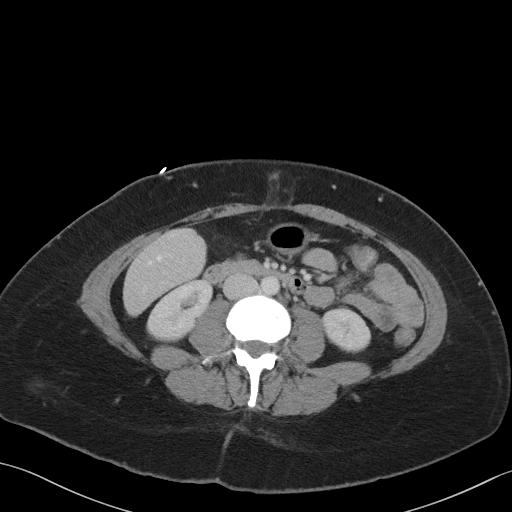
[im 50/84  bone]
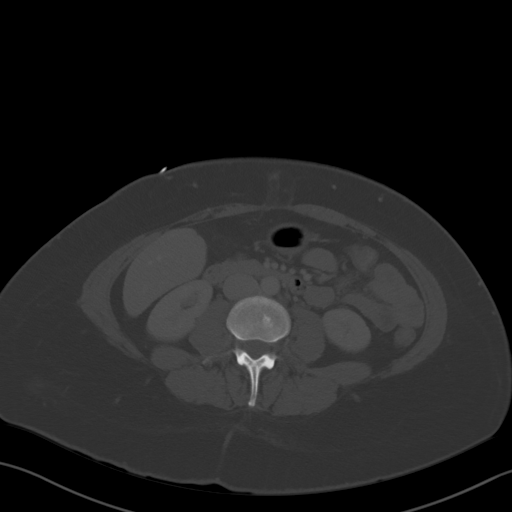
[im 57/84  soft-tissue]
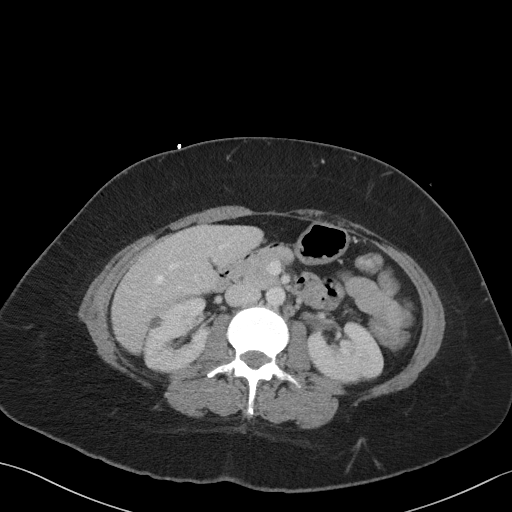
[im 64/84  soft-tissue]
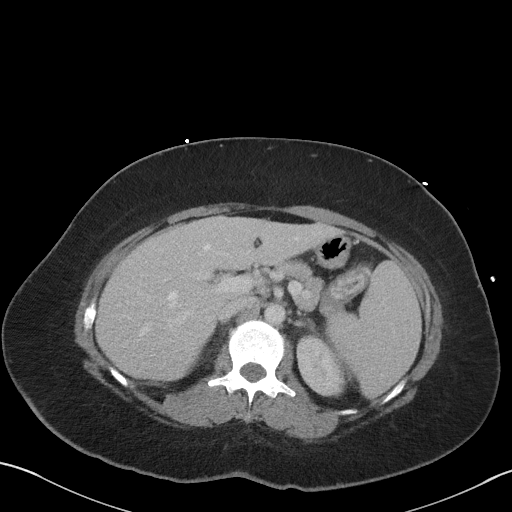
[im 67/84  soft-tissue]
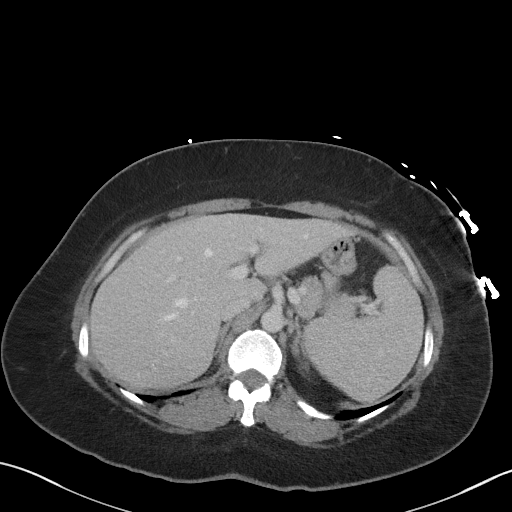
[im 74/84  soft-tissue]
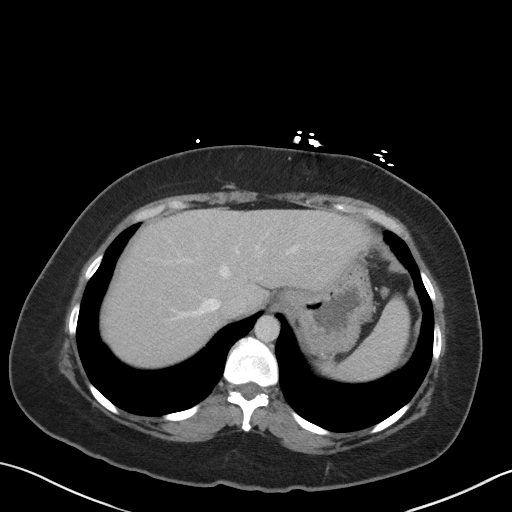
[im 80/84  soft-tissue]
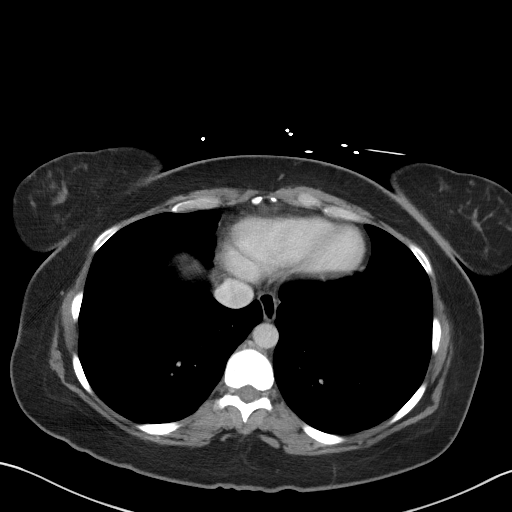

[Series 5: coronal st · coronal · 0.70mm/px · 3 of 89 slices shown]
[im 30/89  soft-tissue]
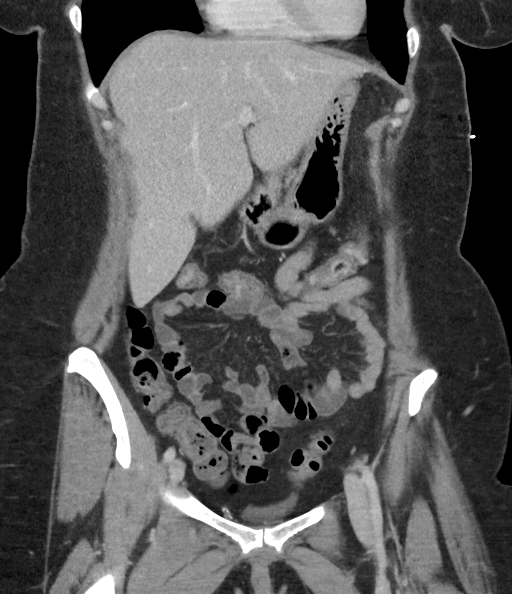
[im 40/89  soft-tissue]
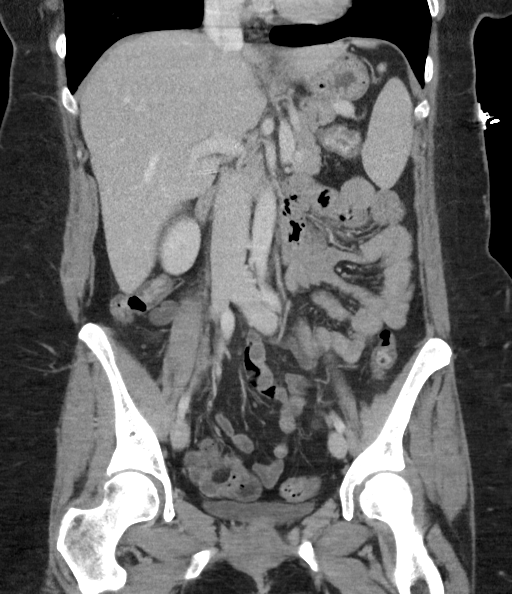
[im 49/89  soft-tissue]
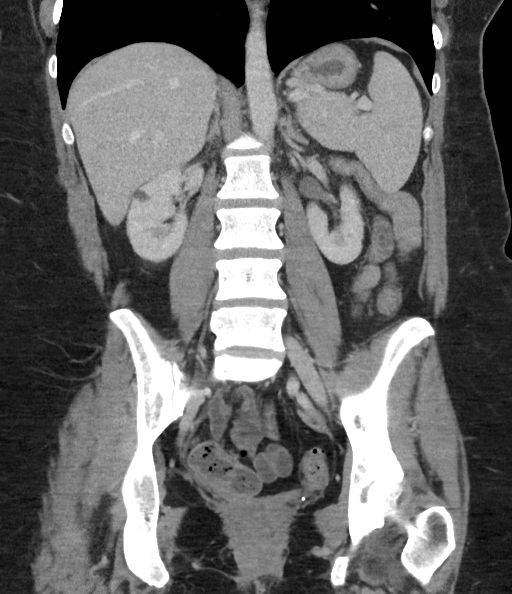

[17 of 46 positions shown; findings below may reference images not displayed]

FINDINGS: Lower chest: Lung bases are clear.

Hepatobiliary: No focal hepatic lesion. Postcholecystectomy. No
biliary dilatation.

Pancreas: Pancreas is normal. No ductal dilatation. No pancreatic
inflammation.

Spleen: Normal spleen

Adrenals/urinary tract: Adrenal glands and kidneys are normal. Small
benign cysts of the RIGHT kidney. The ureters and bladder normal.

Stomach/Bowel: Stomach, small bowel, appendix, and cecum are normal.
The colon and rectosigmoid colon are normal.

Vascular/Lymphatic: Abdominal aorta is normal caliber. There is no
retroperitoneal or periportal lymphadenopathy. No pelvic
lymphadenopathy.

Reproductive: Post hysterectomy anatomy.  No adnexal abnormality

Other: No free fluid.

Musculoskeletal: No aggressive osseous lesion.
IMPRESSION: 1. No acute findings in the abdomen pelvis.
2. No abnormality of the bowel identified.
3. Post hysterectomy and cholecystectomy

## 2018-10-11 ENCOUNTER — Telehealth: Payer: Self-pay | Admitting: *Deleted

## 2018-10-11 ENCOUNTER — Encounter: Payer: Self-pay | Admitting: Adult Health

## 2018-10-11 ENCOUNTER — Ambulatory Visit: Payer: BLUE CROSS/BLUE SHIELD | Admitting: Adult Health

## 2018-10-11 VITALS — BP 124/83 | HR 94 | Ht 63.0 in | Wt 191.0 lb

## 2018-10-11 DIAGNOSIS — R3915 Urgency of urination: Secondary | ICD-10-CM

## 2018-10-11 DIAGNOSIS — R824 Acetonuria: Secondary | ICD-10-CM | POA: Diagnosis not present

## 2018-10-11 DIAGNOSIS — R350 Frequency of micturition: Secondary | ICD-10-CM

## 2018-10-11 DIAGNOSIS — N941 Unspecified dyspareunia: Secondary | ICD-10-CM

## 2018-10-11 DIAGNOSIS — R300 Dysuria: Secondary | ICD-10-CM | POA: Diagnosis not present

## 2018-10-11 LAB — POCT URINALYSIS DIPSTICK OB
Glucose, UA: NEGATIVE
Leukocytes, UA: NEGATIVE
NITRITE UA: NEGATIVE
PROTEIN: NEGATIVE
RBC UA: NEGATIVE

## 2018-10-11 MED ORDER — URIBEL 118 MG PO CAPS: 1.0000 | ORAL_CAPSULE | Freq: Three times a day (TID) | ORAL | 1 refills | Status: DC | PRN

## 2018-10-11 NOTE — Telephone Encounter (Signed)
Spoke with pt advising to ask for cash price for about 20 capsules. Pt voiced understanding. Autumn Beasley

## 2018-10-11 NOTE — Progress Notes (Signed)
  Subjective:     Patient ID: Autumn Beasley, female   DOB: [birth year], 42 y.o.   MRN: [id number]  HPI Venezia is a 42 year old white female, married in complaining with burning after urination, frequency and urgency and pain after sex.This has been going on for over 8 months.She is sp hysterectomy and has bladder sling. She is trying to lose weight and is on low carbs.   Review of Systems +burning after voiding urinary frequency  +urainay urgency  Pain after sex Feels like not emptying bladder Has sling Reviewed past medical,surgical, social and family history. Reviewed medications and allergies.       Objective:   Physical Exam BP 124/83 (BP Location: Left Arm, Patient Position: Sitting, Cuff Size: Normal)   Pulse 94   Ht 5\' 3"  (1.6 m)   Wt 191 lb (86.6 kg)   BMI 33.83 kg/m urine 3+ketones. Skin warm and dry.Pelvic: external genitalia is normal in appearance no lesions, vagina: pink with good moisture and rugae,urethra has no lesions or masses noted, cervix and uterus are absent, adnexa: no masses or tenderness noted. Bladder is  tender and no masses felt.    Assessment:     1. Urinary frequency   2. Urinary urgency   3. Burning with urination   4. Dyspareunia, female   5. Ketonuria       Plan:     UA C&S sent Keep log of any events Meds ordered this encounter  Medications  . Meth-Hyo-M Bl-Na Phos-Ph Sal (URIBEL) 118 MG CAPS    Sig: Take 1 capsule (118 mg total) by mouth 3 (three) times daily as needed.    Dispense:  60 capsule    Refill:  1    Order Specific Question:   Supervising Provider    Answer:   Florian Buff [[id number]]  F/U 11/21

## 2018-10-12 LAB — URINALYSIS, ROUTINE W REFLEX MICROSCOPIC
Bilirubin, UA: NEGATIVE
Glucose, UA: NEGATIVE
Leukocytes, UA: NEGATIVE
NITRITE UA: NEGATIVE
PH UA: 5 (ref 5.0–7.5)
Protein, UA: NEGATIVE
RBC, UA: NEGATIVE
Specific Gravity, UA: 1.025 (ref 1.005–1.030)
UUROB: 0.2 mg/dL (ref 0.2–1.0)

## 2018-10-13 ENCOUNTER — Telehealth: Payer: Self-pay | Admitting: Adult Health

## 2018-10-13 LAB — URINE CULTURE

## 2018-10-13 NOTE — Telephone Encounter (Signed)
Pt aware that urine culture negative,to get uribel today and start it

## 2018-10-13 NOTE — Telephone Encounter (Signed)
Left message to call me with daughter.If she calls urine did not grow anything

## 2018-10-27 ENCOUNTER — Encounter: Payer: Self-pay | Admitting: Adult Health

## 2018-10-27 ENCOUNTER — Ambulatory Visit: Payer: BLUE CROSS/BLUE SHIELD | Admitting: Adult Health

## 2018-10-27 VITALS — BP 121/83 | HR 71 | Ht 63.0 in | Wt 188.0 lb

## 2018-10-27 DIAGNOSIS — R300 Dysuria: Secondary | ICD-10-CM | POA: Diagnosis not present

## 2018-10-27 MED ORDER — PENTOSAN POLYSULFATE SODIUM 100 MG PO CAPS: ORAL_CAPSULE | ORAL | 1 refills | Status: DC

## 2018-10-27 NOTE — Progress Notes (Addendum)
  Subjective:     Patient ID: Coy Saunas, female   DOB: 1975-12-29, 41 y.o.   MRN: [id number]  HPI Merrisa is a 42 year old white female, back in follow up after trying uribel and is better with frequency and urgency but still burning.   Review of Systems Urinary frequency better and urgency better Still has burning and pain with urination, like razor blades cutting Reviewed past medical,surgical, social and family history. Reviewed medications and allergies.     Objective:   Physical Exam BP 121/83 (BP Location: Left Arm, Patient Position: Sitting, Cuff Size: Normal)   Pulse 71   Ht 5\' 3"  (1.6 m)   Wt 188 lb (85.3 kg)   BMI 33.30 kg/m   Talk only: has less urinary frequency and urgency but still has pain with uriantion, discussed with Dr Elonda Husky and will try elmiron 200 mg bid per him and if not better will get he appt with Dr Elonda Husky, since he placed her sling.   Fall risk is low.   Assessment:     1. Burning with urination       Plan:     Meds ordered this encounter  Medications  . pentosan polysulfate (ELMIRON) 100 MG capsule    Sig: Take 2 bid    Dispense:  60 capsule    Refill:  1    Order Specific Question:   Supervising Provider    Answer:   Elonda Husky, LUTHER H [[id number]]  F/U in 4 weeks

## 2018-11-17 ENCOUNTER — Telehealth: Payer: Self-pay | Admitting: Adult Health

## 2018-11-17 NOTE — Telephone Encounter (Signed)
Patient called stating that she would like a call back from [place], [place] states that she is not feeling any better and would like to cancel the appointment on the 19 th and schedule an appointment with Dr. Elonda Husky. Pt states that Anderson Malta know what she is talking about.

## 2018-11-17 NOTE — Telephone Encounter (Signed)
Pt says urgency better, but will cancel appt with me and make with Dr Elonda Husky to check her sling

## 2018-11-24 ENCOUNTER — Ambulatory Visit: Payer: BLUE CROSS/BLUE SHIELD | Admitting: Obstetrics & Gynecology

## 2018-11-24 ENCOUNTER — Encounter (INDEPENDENT_AMBULATORY_CARE_PROVIDER_SITE_OTHER): Payer: Self-pay

## 2018-11-24 ENCOUNTER — Encounter: Payer: Self-pay | Admitting: Obstetrics & Gynecology

## 2018-11-24 ENCOUNTER — Other Ambulatory Visit: Payer: Self-pay

## 2018-11-24 ENCOUNTER — Ambulatory Visit: Payer: BLUE CROSS/BLUE SHIELD | Admitting: Adult Health

## 2018-11-24 VITALS — BP 119/79 | HR 72 | Ht 63.0 in | Wt 186.0 lb

## 2018-11-24 DIAGNOSIS — B373 Candidiasis of vulva and vagina: Secondary | ICD-10-CM

## 2018-11-24 DIAGNOSIS — B3731 Acute candidiasis of vulva and vagina: Secondary | ICD-10-CM

## 2018-11-24 DIAGNOSIS — N949 Unspecified condition associated with female genital organs and menstrual cycle: Secondary | ICD-10-CM | POA: Diagnosis not present

## 2018-11-24 MED ORDER — FLUCONAZOLE 100 MG PO TABS: 100.0000 mg | ORAL_TABLET | Freq: Every day | ORAL | 0 refills | Status: DC

## 2018-11-24 NOTE — Progress Notes (Signed)
Chief Complaint  Patient presents with  . vaginal burning    irritation and bladder issues      42 y.o. [id number] No LMP recorded. Patient has had a hysterectomy. The current method of family planning is status post hysterectomy.  Outpatient Encounter Medications as of 11/24/2018  Medication Sig  . hyoscyamine (LEVSIN SL) 0.125 MG SL tablet Place 1 tablet (0.125 mg total) under the tongue every 4 (four) hours as needed.  . topiramate (TOPAMAX) 100 MG tablet Take 100 mg by mouth daily.   . fluconazole (DIFLUCAN) 100 MG tablet Take 1 tablet (100 mg total) by mouth daily.  . Meth-Hyo-M Bl-Na Phos-Ph Sal (URIBEL) 118 MG CAPS Take 1 capsule (118 mg total) by mouth 3 (three) times daily as needed. (Patient not taking: Reported on 11/24/2018)  . pentosan polysulfate (ELMIRON) 100 MG capsule Take 2 bid (Patient not taking: Reported on 11/24/2018)   No facility-administered encounter medications on file as of 11/24/2018.     Subjective Pt has had vaginal pain/burning since February or March, possibly was on an antibiotic before her symptoms started External and internal no to miniaml discharge Burning after intercourse Uses cold wash clothes and seat warmers soothes it  Past Medical History:  Diagnosis Date  . Asthma   . External hemorrhoids   . Gastropathy   . GERD (gastroesophageal reflux disease)   . Hemorrhoids   . IBS (irritable bowel syndrome)   . Internal hemorrhoids   . Lyme disease   . Migraine     Past Surgical History:  Procedure Laterality Date  . ABDOMINAL HYSTERECTOMY    . CHOLECYSTECTOMY    . INCONTINENCE SURGERY    . TONSILLECTOMY      OB History    Gravida  2   Para  2   Term  2   Preterm      AB      Living  2     SAB      TAB      Ectopic      Multiple      Live Births  2           Allergies  Allergen Reactions  . Morphine And Related   . Penicillins Rash    Has patient had a PCN reaction causing immediate rash,  facial/tongue/throat swelling, SOB or lightheadedness with hypotension: No Has patient had a PCN reaction causing severe rash involving mucus membranes or skin necrosis: No Has patient had a PCN reaction that required hospitalization No Has patient had a PCN reaction occurring within the last 10 years: No If all of the above answers are "NO", then may proceed with Cephalosporin use.     Social History   Socioeconomic History  . Marital status: Married    Spouse name: Not on file  . Number of children:  2  . Years of education: Not on file  . Highest education level: Not on file  Occupational History    Employer: [employer]  . Financial resource strain: Not on file  . Food insecurity:    Worry: Not on file    Inability: Not on file  . Transportation needs:    Medical: Not on file    Non-medical: Not on file  Tobacco Use  . Smoking status: Never Smoker  . Smokeless tobacco: Never Used  Substance and Sexual Activity  . Alcohol use: No  . Drug use: No  . Sexual activity: Yes  Birth control/protection: Surgical    Comment: hyst  Lifestyle  . Physical activity:    Days per week: Not on file    Minutes per session: Not on file  . Stress: Not on file  Relationships  . Social connections:    Talks on phone: Not on file    Gets together: Not on file    Attends religious service: Not on file    Active member of club or organization: Not on file    Attends meetings of clubs or organizations: Not on file    Relationship status: Not on file  Other Topics Concern  . Not on file  Social History Narrative  . Not on file    Family History  Problem Relation Age of Onset  . Diabetes Maternal Aunt   . Diabetes Maternal Grandmother   . Alcohol abuse Maternal Grandfather   . Crohn's disease Sister   . Colon polyps Sister   . Liver disease Maternal Aunt   . COPD Maternal Aunt   . Colon cancer Neg Hx   . Rectal cancer Neg Hx   . Stomach cancer Neg Hx   . Esophageal  cancer Neg Hx     Medications:       Current Outpatient Medications:  .  hyoscyamine (LEVSIN SL) 0.125 MG SL tablet, Place 1 tablet (0.125 mg total) under the tongue every 4 (four) hours as needed., Disp: 60 tablet, Rfl: 3 .  topiramate (TOPAMAX) 100 MG tablet, Take 100 mg by mouth daily. , Disp: , Rfl:  .  fluconazole (DIFLUCAN) 100 MG tablet, Take 1 tablet (100 mg total) by mouth daily., Disp: 14 tablet, Rfl: 0 .  Meth-Hyo-M Bl-Na Phos-Ph Sal (URIBEL) 118 MG CAPS, Take 1 capsule (118 mg total) by mouth 3 (three) times daily as needed. (Patient not taking: Reported on 11/24/2018), Disp: 60 capsule, Rfl: 1 .  pentosan polysulfate (ELMIRON) 100 MG capsule, Take 2 bid (Patient not taking: Reported on 11/24/2018), Disp: 60 capsule, Rfl: 1  Objective Blood pressure 119/79, pulse 72, height 5\' 3"  (1.6 m), weight 186 lb (84.4 kg).  General WDWN female NAD Vulva:  normal appearing vulva with no masses, tenderness or lesions Vagina:  normal mucosa, curd-like discharge, atrophic Cervix:  absent Uterus:  uterus absent Adnexa: ovaries:present,  normal adnexa in size, nontender and no masses   Pertinent ROS No burning with urination, frequency or urgency No nausea, vomiting or diarrhea Nor fever chills or other constitutional symptoms   Labs or studies Had a recent A1C    Impression Diagnoses this Encounter::   ICD-10-CM   1. Vaginal burning N94.9   2. Yeast vaginitis B37.3     Established relevant diagnosis(es):   Plan/Recommendations: Meds ordered this encounter  Medications  . fluconazole (DIFLUCAN) 100 MG tablet    Sig: Take 1 tablet (100 mg total) by mouth daily.    Dispense:  14 tablet    Refill:  0    Labs or Scans Ordered: No orders of the defined types were placed in this encounter.   Management:: >will start with aggressive yeast managemtn and see back in 3 weeks >most likely this is more complex than just yeast, will re evaluate in 3 weks  Follow up Return  in about 3 weeks (around 12/15/2018) for Follow up, with Dr Elonda Husky.     All questions were answered.

## 2018-12-13 ENCOUNTER — Encounter: Payer: Self-pay | Admitting: Obstetrics & Gynecology

## 2018-12-13 ENCOUNTER — Ambulatory Visit: Payer: BLUE CROSS/BLUE SHIELD | Admitting: Obstetrics & Gynecology

## 2018-12-13 VITALS — BP 129/86 | HR 88 | Ht 63.0 in | Wt 184.0 lb

## 2018-12-13 DIAGNOSIS — N9489 Other specified conditions associated with female genital organs and menstrual cycle: Secondary | ICD-10-CM | POA: Diagnosis not present

## 2018-12-13 DIAGNOSIS — M792 Neuralgia and neuritis, unspecified: Secondary | ICD-10-CM | POA: Diagnosis not present

## 2018-12-13 DIAGNOSIS — R102 Pelvic and perineal pain: Secondary | ICD-10-CM

## 2018-12-13 MED ORDER — ESTROGENS, CONJUGATED 0.625 MG/GM VA CREA: TOPICAL_CREAM | VAGINAL | 12 refills | Status: DC

## 2018-12-13 MED ORDER — GABAPENTIN 100 MG PO CAPS: ORAL_CAPSULE | ORAL | 1 refills | Status: DC

## 2018-12-13 NOTE — Progress Notes (Signed)
Chief Complaint  Patient presents with  . Follow-up    on vaginal burning; not any better      43 y.o. [id number] No LMP recorded. Patient has had a hysterectomy. The current method of family planning is status post hysterectomy.  Outpatient Encounter Medications as of 12/13/2018  Medication Sig  . hyoscyamine (LEVSIN SL) 0.125 MG SL tablet Place 1 tablet (0.125 mg total) under the tongue every 4 (four) hours as needed.  . topiramate (TOPAMAX) 100 MG tablet Take 100 mg by mouth daily.   Marland Kitchen conjugated estrogens (PREMARIN) vaginal cream Use 1 gram nightly  . gabapentin (NEURONTIN) 100 MG capsule 1 capsule three times a day for 1 week, then 2 capsules three times a day for 1 week then 3 capsules 3 times daily  . [DISCONTINUED] fluconazole (DIFLUCAN) 100 MG tablet Take 1 tablet (100 mg total) by mouth daily.  . [DISCONTINUED] Meth-Hyo-M Bl-Na Phos-Ph Sal (URIBEL) 118 MG CAPS Take 1 capsule (118 mg total) by mouth 3 (three) times daily as needed. (Patient not taking: Reported on 11/24/2018)  . [DISCONTINUED] pentosan polysulfate (ELMIRON) 100 MG capsule Take 2 bid (Patient not taking: Reported on 11/24/2018)   No facility-administered encounter medications on file as of 12/13/2018.     Subjective Pt has been suffering with an acute vulvovaginal pain since February of 2019 I saw here intitially for this problem last month, 11/24/2018 and feel this is neurogenic pain but I wanted to rule out some issues first, treated her aggressively for yeast which not surprisingly has not helped   Past Medical History:  Diagnosis Date  . Asthma   . External hemorrhoids   . Gastropathy   . GERD (gastroesophageal reflux disease)   . Hemorrhoids   . IBS (irritable bowel syndrome)   . Internal hemorrhoids   . Lyme disease   . Migraine     Past Surgical History:  Procedure Laterality Date  . ABDOMINAL HYSTERECTOMY    . CHOLECYSTECTOMY    . INCONTINENCE SURGERY    . TONSILLECTOMY      OB  History    Gravida  2   Para  2   Term  2   Preterm      AB      Living  2     SAB      TAB      Ectopic      Multiple      Live Births  2           Allergies  Allergen Reactions  . Morphine And Related   . Penicillins Rash    Has patient had a PCN reaction causing immediate rash, facial/tongue/throat swelling, SOB or lightheadedness with hypotension: No Has patient had a PCN reaction causing severe rash involving mucus membranes or skin necrosis: No Has patient had a PCN reaction that required hospitalization No Has patient had a PCN reaction occurring within the last 10 years: No If all of the above answers are "NO", then may proceed with Cephalosporin use.     Social History   Socioeconomic History  . Marital status: Married    Spouse name: Not on file  . Number of children:  2  . Years of education: Not on file  . Highest education level: Not on file  Occupational History    Employer: [employer]  . Financial resource strain: Not on file  . Food insecurity:    Worry: Not on file  Inability: Not on file  . Transportation needs:    Medical: Not on file    Non-medical: Not on file  Tobacco Use  . Smoking status: Never Smoker  . Smokeless tobacco: Never Used  Substance and Sexual Activity  . Alcohol use: No  . Drug use: No  . Sexual activity: Yes    Birth control/protection: Surgical    Comment: hyst  Lifestyle  . Physical activity:    Days per week: Not on file    Minutes per session: Not on file  . Stress: Not on file  Relationships  . Social connections:    Talks on phone: Not on file    Gets together: Not on file    Attends religious service: Not on file    Active member of club or organization: Not on file    Attends meetings of clubs or organizations: Not on file    Relationship status: Not on file  Other Topics Concern  . Not on file  Social History Narrative  . Not on file    Family History  Problem Relation Age  of Onset  . Diabetes Maternal Aunt   . Diabetes Maternal Grandmother   . Alcohol abuse Maternal Grandfather   . Crohn's disease Sister   . Colon polyps Sister   . Liver disease Maternal Aunt   . COPD Maternal Aunt   . Colon cancer Neg Hx   . Rectal cancer Neg Hx   . Stomach cancer Neg Hx   . Esophageal cancer Neg Hx     Medications:       Current Outpatient Medications:  .  hyoscyamine (LEVSIN SL) 0.125 MG SL tablet, Place 1 tablet (0.125 mg total) under the tongue every 4 (four) hours as needed., Disp: 60 tablet, Rfl: 3 .  topiramate (TOPAMAX) 100 MG tablet, Take 100 mg by mouth daily. , Disp: , Rfl:  .  conjugated estrogens (PREMARIN) vaginal cream, Use 1 gram nightly, Disp: 30 g, Rfl: 12 .  gabapentin (NEURONTIN) 100 MG capsule, 1 capsule three times a day for 1 week, then 2 capsules three times a day for 1 week then 3 capsules 3 times daily, Disp: 126 capsule, Rfl: 1  Objective Blood pressure 129/86, pulse 88, height 5\' 3"  (1.6 m), weight 184 lb (83.5 kg).  General WDWN female NAD Vulva:  normal appearing vulva with no masses, tenderness or lesions Vagina:  normal mucosa, no discharge Cervix:  absent Uterus:  normal size, contour, position, consistency, mobility, non-tender Adnexa: ovaries:present,  normal adnexa in size, nontender and no masses   Pertinent ROS No burning with urination, frequency or urgency No nausea, vomiting or diarrhea Nor fever chills or other constitutional symptoms   Labs or studies Pending Hgb A1C    Impression Diagnoses this Encounter::   ICD-10-CM   1. 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 Vulvovaginal pain N94.89 Hemoglobin A1C    Established relevant diagnosis(es):   Plan/Recommendations: Meds ordered this encounter  Medications  . gabapentin (NEURONTIN) 100 MG capsule    Sig: 1 capsule three times a day for 1 week, then 2 capsules three times a day for 1 week then 3 capsules 3 times daily    Dispense:  126 capsule     Refill:  1  . conjugated estrogens (PREMARIN) vaginal cream    Sig: Use 1 gram nightly    Dispense:  30 g    Refill:  12    Labs or Scans Ordered: Orders Placed This Encounter  Procedures  . Hemoglobin A1C    Management:: >begin trial of gabapentin along with vaginal estrogen  >my strong thought is that this is neurogenic pain  Follow up Return in about 1 month (around 01/13/2019) for Follow up, with Dr Elonda Husky.    All questions were answered.

## 2018-12-14 LAB — HEMOGLOBIN A1C
Est. average glucose Bld gHb Est-mCnc: 97 mg/dL
HEMOGLOBIN A1C: 5 % (ref 4.8–5.6)

## 2018-12-15 ENCOUNTER — Ambulatory Visit: Payer: BLUE CROSS/BLUE SHIELD | Admitting: Obstetrics & Gynecology

## 2018-12-19 ENCOUNTER — Telehealth: Payer: Self-pay | Admitting: *Deleted

## 2018-12-19 NOTE — Telephone Encounter (Signed)
Pt called requesting lab results. DOB verifed. Informed pt AIC was 5.0. Pt verbalized understanding.

## 2019-01-13 ENCOUNTER — Ambulatory Visit: Payer: BLUE CROSS/BLUE SHIELD | Admitting: Obstetrics & Gynecology

## 2019-01-13 ENCOUNTER — Encounter: Payer: Self-pay | Admitting: Obstetrics & Gynecology

## 2019-01-13 VITALS — BP 109/82 | HR 68 | Ht 68.0 in | Wt 184.5 lb

## 2019-01-13 DIAGNOSIS — R102 Pelvic and perineal pain: Secondary | ICD-10-CM | POA: Diagnosis not present

## 2019-01-13 DIAGNOSIS — M792 Neuralgia and neuritis, unspecified: Secondary | ICD-10-CM | POA: Diagnosis not present

## 2019-01-13 DIAGNOSIS — N9489 Other specified conditions associated with female genital organs and menstrual cycle: Secondary | ICD-10-CM

## 2019-01-13 NOTE — Progress Notes (Signed)
Chief Complaint  Patient presents with  . Follow-up      43 y.o. [id number] No LMP recorded. Patient has had a hysterectomy. The current method of family planning is status post hysterectomy.  Outpatient Encounter Medications as of 01/13/2019  Medication Sig  . gabapentin (NEURONTIN) 100 MG capsule 1 capsule three times a day for 1 week, then 2 capsules three times a day for 1 week then 3 capsules 3 times daily  . hyoscyamine (LEVSIN SL) 0.125 MG SL tablet Place 1 tablet (0.125 mg total) under the tongue every 4 (four) hours as needed.  . topiramate (TOPAMAX) 100 MG tablet Take 100 mg by mouth daily.   Marland Kitchen conjugated estrogens (PREMARIN) vaginal cream Use 1 gram nightly (Patient not taking: Reported on 01/13/2019)   No facility-administered encounter medications on file as of 01/13/2019.     Subjective Pt is back in for follow up of her very severe and unusual vulvovaginal pain Really now trhe pain is left>>right on the mons pubis Can't stand for even her clothes to touch it No lesions Sensation is burning mostly right is minimal Could not have intercourse Past Medical History:  Diagnosis Date  . Asthma   . External hemorrhoids   . Gastropathy   . GERD (gastroesophageal reflux disease)   . Hemorrhoids   . IBS (irritable bowel syndrome)   . Internal hemorrhoids   . Lyme disease   . Migraine     Past Surgical History:  Procedure Laterality Date  . ABDOMINAL HYSTERECTOMY    . CHOLECYSTECTOMY    . INCONTINENCE SURGERY    . TONSILLECTOMY      OB History    Gravida  2   Para  2   Term  2   Preterm      AB      Living  2     SAB      TAB      Ectopic      Multiple      Live Births  2           Allergies  Allergen Reactions  . Morphine And Related   . Penicillins Rash    Has patient had a PCN reaction causing immediate rash, facial/tongue/throat swelling, SOB or lightheadedness with hypotension: No Has patient had a PCN reaction causing severe  rash involving mucus membranes or skin necrosis: No Has patient had a PCN reaction that required hospitalization No Has patient had a PCN reaction occurring within the last 10 years: No If all of the above answers are "NO", then may proceed with Cephalosporin use.     Social History   Socioeconomic History  . Marital status: Married    Spouse name: Not on file  . Number of children:  2  . Years of education: Not on file  . Highest education level: Not on file  Occupational History    Employer: [employer]  . Financial resource strain: Not on file  . Food insecurity:    Worry: Not on file    Inability: Not on file  . Transportation needs:    Medical: Not on file    Non-medical: Not on file  Tobacco Use  . Smoking status: Never Smoker  . Smokeless tobacco: Never Used  Substance and Sexual Activity  . Alcohol use: No  . Drug use: No  . Sexual activity: Yes    Birth control/protection: Surgical    Comment: hyst  Lifestyle  .  Physical activity:    Days per week: Not on file    Minutes per session: Not on file  . Stress: Not on file  Relationships  . Social connections:    Talks on phone: Not on file    Gets together: Not on file    Attends religious service: Not on file    Active member of club or organization: Not on file    Attends meetings of clubs or organizations: Not on file    Relationship status: Not on file  Other Topics Concern  . Not on file  Social History Narrative  . Not on file    Family History  Problem Relation Age of Onset  . Diabetes Maternal Aunt   . Diabetes Maternal Grandmother   . Alcohol abuse Maternal Grandfather   . Crohn's disease Sister   . Colon polyps Sister   . Liver disease Maternal Aunt   . COPD Maternal Aunt   . Colon cancer Neg Hx   . Rectal cancer Neg Hx   . Stomach cancer Neg Hx   . Esophageal cancer Neg Hx     Medications:       Current Outpatient Medications:  .  gabapentin (NEURONTIN) 100 MG capsule, 1  capsule three times a day for 1 week, then 2 capsules three times a day for 1 week then 3 capsules 3 times daily, Disp: 126 capsule, Rfl: 1 .  hyoscyamine (LEVSIN SL) 0.125 MG SL tablet, Place 1 tablet (0.125 mg total) under the tongue every 4 (four) hours as needed., Disp: 60 tablet, Rfl: 3 .  topiramate (TOPAMAX) 100 MG tablet, Take 100 mg by mouth daily. , Disp: , Rfl:  .  conjugated estrogens (PREMARIN) vaginal cream, Use 1 gram nightly (Patient not taking: Reported on 01/13/2019), Disp: 30 g, Rfl: 12  Objective Blood pressure 109/82, pulse 68, height 5\' 8"  (1.727 m), weight 184 lb 8 oz (83.7 kg).  General WDWN female NAD Vulva:  Pain of mons down the left vulva does not cross to right no post herpetic neuralgia history Vagina:  normal mucosa, no discharge Cervix:   Uterus:   Adnexa: ovaries:,     Pertinent ROS No burning with urination, frequency or urgency No nausea, vomiting or diarrhea Nor fever chills or other constitutional symptoms   Labs or studies Trigger Point Injection   Pre-operative diagnosis: neurogenic pain  Post-operative diagnosis: neurogenic pain  After risks and benefits were explained including bleeding, infection, worsening of the pain, damage to the area being injected, weakness, allergic reaction to medications, vascular injection, and nerve damage, signed consent was obtained.  All questions were answered.    The area of the trigger point was identified and the skin prepped three times with alcohol and the alcohol allowed to dry.  Next, a 25 gauge 0.5 inch needle was placed in the area of the trigger point.  Once reproduction of the pain was elicited and negative aspiration confirmed, the trigger point was injected and the needle removed.    The patient did tolerate the procedure well and there were not complications.    Medication used: 20 cc 0.5% marcain plain    Trigger points injected: 3    Trigger point(s) location(s):  left      Impression Diagnoses this Encounter::   ICD-10-CM   1. 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 Pelvic and perineal pain R10.2 MR PELVIS W CONTRAST    MR PELVIS W CONTRAST  3. Vulvovaginal pain N94.89  Established relevant diagnosis(es):   Plan/Recommendations: No orders of the defined types were placed in this encounter.   Labs or Scans Ordered: Orders Placed This Encounter  Procedures  . MR PELVIS W CONTRAST    Management:: >will do MRI to evlauate for any evidnce of nerve compression >continue increasing gabapentin to 300 TID  Follow up Return in about 3 weeks (around 02/03/2019).         All questions were answered.

## 2019-01-17 ENCOUNTER — Other Ambulatory Visit: Payer: Self-pay | Admitting: Obstetrics & Gynecology

## 2019-01-17 ENCOUNTER — Telehealth: Payer: Self-pay | Admitting: Obstetrics & Gynecology

## 2019-01-17 NOTE — Telephone Encounter (Signed)
Patient called, stated that Dr. Elonda Husky is sending her to have a MRI.  Central scheduling is ready to schedule it per the patient, but she stated that they told her that Dr. Elonda Husky didn't fill out something correctly.    [phone]

## 2019-01-18 ENCOUNTER — Other Ambulatory Visit: Payer: Self-pay | Admitting: *Deleted

## 2019-01-18 DIAGNOSIS — R102 Pelvic and perineal pain: Secondary | ICD-10-CM

## 2019-01-18 NOTE — Telephone Encounter (Signed)
Disregard previous note thanks

## 2019-01-18 NOTE — Telephone Encounter (Signed)
Patient scheduled for MRI on 2/19 at [facility] @ 4pm. Needs to arrive at 3:30 and nothing to eat or drink 4 hours prior to test.  Pt made aware.

## 2019-01-18 NOTE — Telephone Encounter (Signed)
Pt needs MRI order to state with and without contrast. They will not schedule it until it is changed.

## 2019-01-19 ENCOUNTER — Encounter: Payer: Self-pay | Admitting: Obstetrics & Gynecology

## 2019-01-25 ENCOUNTER — Ambulatory Visit (HOSPITAL_COMMUNITY)
Admission: RE | Admit: 2019-01-25 | Discharge: 2019-01-25 | Disposition: A | Discharge status: Home / Self Care | Payer: BLUE CROSS/BLUE SHIELD | Source: Ambulatory Visit | Attending: Obstetrics & Gynecology | Admitting: Obstetrics & Gynecology

## 2019-01-25 DIAGNOSIS — R102 Pelvic and perineal pain: Secondary | ICD-10-CM | POA: Diagnosis present

## 2019-01-25 MED ORDER — GADOBUTROL 1 MMOL/ML IV SOLN
8.0000 mL | Freq: Once | INTRAVENOUS | Status: AC | PRN
  Administered 2019-01-25: 8 mL via INTRAVENOUS

## 2019-01-27 ENCOUNTER — Encounter: Payer: Self-pay | Admitting: Obstetrics & Gynecology

## 2019-01-27 ENCOUNTER — Other Ambulatory Visit (HOSPITAL_COMMUNITY)
Admission: RE | Admit: 2019-01-27 | Discharge: 2019-01-27 | Disposition: A | Discharge status: Home / Self Care | Payer: BLUE CROSS/BLUE SHIELD | Source: Ambulatory Visit | Attending: Obstetrics & Gynecology | Admitting: Obstetrics & Gynecology

## 2019-01-27 ENCOUNTER — Ambulatory Visit (INDEPENDENT_AMBULATORY_CARE_PROVIDER_SITE_OTHER): Payer: BLUE CROSS/BLUE SHIELD | Admitting: Obstetrics & Gynecology

## 2019-01-27 VITALS — BP 115/75 | HR 74 | Ht 63.0 in | Wt 185.0 lb

## 2019-01-27 DIAGNOSIS — N904 Leukoplakia of vulva: Secondary | ICD-10-CM | POA: Diagnosis not present

## 2019-01-27 DIAGNOSIS — R1030 Lower abdominal pain, unspecified: Secondary | ICD-10-CM

## 2019-01-27 DIAGNOSIS — R32 Unspecified urinary incontinence: Secondary | ICD-10-CM

## 2019-01-27 NOTE — Patient Instructions (Signed)
Vulva Biopsy, Care After This sheet gives you information about how to care for yourself after your procedure. Your health care provider may also give you more specific instructions. If you have problems or questions, contact your health care provider. What can I expect after the procedure? After the procedure, it is common to have:  Slight bleeding from the biopsy site.  Discomfort at the biopsy site. Follow these instructions at home: Biopsy site care   Follow instructions from your health care provider about how to take care of your biopsy site. Make sure you: ? Clean the area using water and mild soap twice a day or as told by your health care provider. Gently pat the area dry. ? If you were prescribed an antibiotic ointment, apply it as told by your health care provider. Do not stop using the antibiotic even if your condition improves. ? Take a warm water bath (sitz bath) as needed to help with pain and discomfort. A sitz bath is taken while you are sitting down. The water should only come up to your hips and should cover your buttocks. ? Leave stitches (sutures), skin glue, or adhesive strips in place. These skin closures may need to stay in place for 2 weeks or longer. If adhesive strip edges start to loosen and curl up, you may trim the loose edges. Do not remove adhesive strips completely unless your health care provider tells you to do that.  Check your biopsy site every day for signs of infection. Check for: ? More redness, swelling, or pain. ? More fluid or blood. ? Warmth. ? Pus or a bad smell.  Do not rub the biopsy area after urinating. Gently pat the area dry or use a bottle filled with warm water (peri-bottle) to clean the area. Gently wipe from front to back. Lifestyle  Wear loose, cotton underwear. Do not wear tight pants.  Do not use a tampon, douche, or put anything inside your vagina for at least 1 week or until your health care provider approves.  Do not have sex  for at least 1 week or until your health care provider approves.  Do not exercise, such as running or biking, until your health care provider approves.  Do not swim or use a hot tub until your health care provider approves. You may shower or take a sitz bath. General instructions  Take over-the-counter and prescription medicines only as told by your health care provider.  Use a sanitary napkin until the bleeding stops.  Keep all follow-up visits as told by your health care provider. This is important. Contact a health care provider if:  You have more redness, swelling, or pain around your biopsy site.  You have more fluid or blood coming from your biopsy site.  Your biopsy site feels warm to the touch.  Your pain is not controlled with medicine. Get help right away if you have:  Heavy bleeding from the vulva.  Pus or a bad smell coming from your biopsy site.  A fever.  Lower abdominal pain. Summary  After the procedure, it is common to have slight bleeding and discomfort at the biopsy site.  Follow instructions from your health care provider after your biopsy. Make sure you clean the area with water and mild soap. Pat the area dry.  Take sitz baths as needed to help with pain and discomfort. Leave any sutures in place.  Check your biopsy site for signs of infection, which may include more redness, swelling, pain, fluid,  or blood, or feeling warm to the touch.  Get help right away if you have heavy bleeding, a fever, pus or a bad smell, or pain in the lower abdomen. This information is not intended to replace advice given to you by your health care provider. Make sure you discuss any questions you have with your health care provider. Document Released: 11/09/2012 Document Revised: 05/26/2018 Document Reviewed: 05/26/2018 Elsevier Interactive Patient Education  2019 [place] Bi[facility]  A vulva biopsy is a procedure in which a sample of tissue from the vulva is  removed and examined under a microscope. The vulva is the outside part of the female genitals. The vulva includes the outside folds of skin (labia majora), the inner lips (labia minora), the clitoris, and the openings of the urethra and vagina. Your health care provider may order this procedure to diagnose a lesion, growth, rash, blister, or other tissue changes. This procedure may also be done to remove a mole or wart. Tell a health care provider about:  Any allergies you have.  All medicines you are taking, including vitamins, herbs, eye drops, creams, and over-the-counter medicines.  Any problems you or family members have had with anesthetic medicines.  Any blood disorders you have.  Any surgeries you have had.  Any medical conditions you have.  Whether you are pregnant or may be pregnant. What are the risks? Generally, this is a safe procedure. However, problems may occur, including:  Infection.  Bleeding.  Allergic reaction to medicines.  Damage to other nearby structures or organs.  Pain at the biopsy site. What happens before the procedure? Medicines Ask your health care provider about:  Changing or stopping your regular medicines. This is especially important if you are taking diabetes medicines or blood thinners.  Taking medicines such as aspirin and ibuprofen. These medicines can thin your blood. Do not take these medicines unless your health care provider tells you to take them.  Taking over-the-counter medicines, vitamins, herbs, and supplements. General instructions  Wear loose and comfortable clothing and underwear for the procedure.  Follow instructions from your health care provider about eating or drinking restrictions.  Ask your health care provider what steps will be taken to help prevent infection. These may include: ? Removing hair at the surgery site. ? Washing skin with a germ-killing soap. ? Taking antibiotic medicine. What happens during the  procedure?  You will be given a medicine to numb the area (local anesthetic).  A small sample of tissue will be removed (excised). This tissue will be sent to a lab for testing.  A medicine may be put on the biopsy site to help stop the bleeding.  The biopsy site may be closed with stitches (sutures). The procedure may vary among health care providers and hospitals. What happens after the procedure?  You may be given pain medicine.  You may be given antibiotic ointment.  It is up to you to get the results of your procedure. Ask your health care provider, or the department that is doing the procedure, when your results will be ready. Summary  A vulva biopsy is a procedure in which a sample of tissue from the vulva is removed and examined under a microscope.  Your health care provider may order this procedure to diagnose a lesion, growth, rash, blister, or other tissue changes.  Generally, this is a safe procedure. However, problems may occur, including infection, bleeding, or allergic reaction to medicines.  Ask your health care provider, or the department  that is doing the procedure, when your results will be ready. This information is not intended to replace advice given to you by your health care provider. Make sure you discuss any questions you have with your health care provider. Document Released: 11/09/2012 Document Revised: 05/26/2018 Document Reviewed: 05/26/2018 Elsevier Interactive Patient Education  2019 [place]: Soap: UNSCENTED Dove (white box light green writing) Laundry detergent (underwear)- Dreft or [employer] unscented WHITE 100% cotton panties (NOT just cotton crouch) Sanitary napkin/panty liners: UNSCENTED.  If it doesn't SAY unscented it can have a scent/perfume    NO PERFUMES OR LOTIONS OR POTIONS in the vulvar area (may use regular [place]) Condoms: hypoallergenic only. Non dyed (no color) Toilet papers: white only Wash clothes: use a  separate wash cloth. WHITE.  Washed in Dreft.

## 2019-01-27 NOTE — Progress Notes (Signed)
Subjective:     Autumn Beasley is a 43 y.o. female [phone] SVD x 2.  LMP 2005 s/p hyst due to AUB, who presents for second opinion for vulvar pain and burning that is present in a descrete area on the left side of her vulva.   Pt had a bladder sling in 2005 at the same time as the hyst. She does not know the type.  Current complaints: pelvic pain. Pt reports pain that began in Feb 2019. Initially the pain was after intercourse then it was intermitently.  The pain was on the left side of the vaginal area. It felt like it could be a burning. The sx began gradually.  But she fel that it extending or radiated into the inside of the vagina. She was seen by a NP and was told that it could be  Interstitial cyctitis and was started on Elmiron. She did not get relief from her sx on this med. She was seen by Dr. Elonda Husky and he dx'd her with yeast and painted Gentian violet and that made her sx worse. 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 She was prescribed 3 tabs tid. She has a busy life with 2 teenage daugters and does not like the sedation of the meds. She is not sure that it is helping and is not convinced with that this is what is occuring. She had an MRI which was normal. She is here for a second opionion.  There is also a pain on the right side above the hair line that began 1 month prev. She is not sure if this pain is related to the prev pain. She was told that she has irritable bowel syndrome. She also reports urge sx that have occurred over the last 2-3 months.  The pain in the vaginal area supercedes all of the other sx.    Was last sexually active 1 month ago and the pain was so severe that she became nauseated from the pain. Her last intercourse prior to that was 4 months prev. The pain is now made worse even by the clothing she wears. She reports that it sometimes feels like a cut and sometimes a burn or like raw skin.   Pt reports that she does leak urine that requires a panty lines  daily. Pt drinks no caffeine.      Pt is s/p T&A and Lap chole 1997 .   Gynecologic History No LMP recorded. Patient has had a hysterectomy. Contraception: status post hysterectomy Last Pap: 2005 Last mammogram: 2019(?)  Obstetric History OB History  Gravida Para Term Preterm AB Living  4 2 2   2 2   SAB TAB Ectopic Multiple Live Births  2       2    # Outcome Date GA Lbr Len/2nd Weight Sex Delivery Anes PTL Lv  4 Term 2005 [redacted]w[redacted]d   F Vag-Spont None  LIV  3 SAB 2004          2 SAB 2003          1 Term 2001 [redacted]w[redacted]d   F Vag-Spont EPI  LIV   The following portions of the patient's history were reviewed and updated as appropriate: allergies, current medications, past family history, past medical history, past social history, past surgical history and problem list.  Review of Systems Pertinent items are noted in HPI.    Objective:  BP 115/75   Pulse 74   Ht 5\' 3"  (1.6 m)  Wt 185 lb 0.6 oz (83.9 kg)   BMI 32.78 kg/m  Pt in NAD  CONSTITUTIONAL: Well-developed, well-nourished female in no acute distress.  HENT:  Normocephalic, atraumatic EYES: Conjunctivae and EOM are normal. No scleral icterus.  NECK: Normal range of motion SKIN: Skin is warm and dry. No rash noted. Not diaphoretic.No pallor. [place]: Alert and oriented to person, place, and time. Normal coordination.  GU: EGBUS: on the left labia majus, there is a raised erythematous  inflammed area that is 2 x 4 cm. This does NOT extend into the vaginal canal. It marks out as the exact are that the pt describes. There is no skin disruption in that area.   (see pic below) Vagina: no blood in vault Cervix: no lesion; no mucopurulent d/c Uterus: small, mobile Adnexa: no masses; non tender    Physical Exam  Genitourinary:     VULVAR BIOPSY NOTE The indications for vulvar biopsy (rule out neoplasia, establish lichen sclerosus diagnosis) were reviewed.   Risks of the biopsy including pain, bleeding, infection, inadequate  specimen, scarring and need for additional procedures  were discussed. The patient stated understanding and agreed to undergo procedure today. Consent was signed,  time out performed.  The patient's vulva was prepped with Betadine. 1% lidocaine was injected into labia majora in 2 locations.  A 6-mm punch biopsy was done, biopsy tissue was picked up with sterile forceps and sterile scissors were used to excise the lesion.  Small bleeding was noted and hemostasis was achieved using Monsel's solution.  The patient tolerated the procedure well.      Assessment:  vulvar pain and lesions s/p vulvar biopsy      f/u in 2 weeks or sooner prn  F/u surg path  No meds or recommendations until biopsies return  May keep Gabapentin until results known.   Abd pain- knot on abd wall. Suspect a msc issue. Rec reeval on f/u  Incontinence- pt does not consider it an issue at present. Can reassess once the vulvar issues is under control.   Total face-to-face time with patient was 30 min not including biopsies).  Greater than 50% was spent in counseling and coordination of care with the patient.   Raeghan Demeter L. Harraway-Smith, M.D., Cherlynn June

## 2019-01-29 ENCOUNTER — Encounter: Payer: Self-pay | Admitting: Obstetrics & Gynecology

## 2019-02-02 ENCOUNTER — Telehealth: Payer: Self-pay | Admitting: Obstetrics & Gynecology

## 2019-02-02 DIAGNOSIS — N9069 Other specified hypertrophy of vulva: Secondary | ICD-10-CM

## 2019-02-02 MED ORDER — CLOBETASOL PROPIONATE 0.05 % EX OINT: 1.0000 "application " | TOPICAL_OINTMENT | Freq: Two times a day (BID) | CUTANEOUS | 2 refills | Status: DC

## 2019-02-02 NOTE — Telephone Encounter (Signed)
TC to pt. I have reviewed the surg path report and the fungal stain. Pt was notified that this is chronic inflammation.  I have explained to her that there is not a specific cause noted at this time but, reviewed the Ddx.  We will treat with Clobetasol at present.   Pt will reschedule her f/u for next week to 4 weeks after tx for that duration.   All questions answered.   Kamryn Gauthier L. Harraway-Smith, M.D., Cherlynn June

## 2019-02-03 ENCOUNTER — Ambulatory Visit: Payer: BLUE CROSS/BLUE SHIELD | Admitting: Obstetrics & Gynecology

## 2019-02-06 ENCOUNTER — Ambulatory Visit: Payer: BLUE CROSS/BLUE SHIELD | Admitting: Obstetrics & Gynecology

## 2019-02-09 ENCOUNTER — Telehealth: Payer: Self-pay | Admitting: Obstetrics & Gynecology

## 2019-02-09 NOTE — Telephone Encounter (Signed)
TC to pt. She left a msg that her pain was worse. She reports that she recently stopped the Gabapentin. She feels like the pain has moved from the left side to both sides. She reports that she is experiencing the same pain as prior to starting the Blain. She reports that it feels like she the sensation of 'feeling that acid has ben poured on her.'    Pt reports that when she voids she feels like her vulva is burning or on fire.   REC restarting the Gabapentin at night to see if it helps. I have also recommended coconut oil to the entire vulva bid and cont Clobetasol bid only to the lesion on the left side.   All questions answered.     Keyvon Herter L. Harraway-Smith, M.D., Cherlynn June

## 2019-02-09 NOTE — Telephone Encounter (Signed)
Pt called checking status of provider response to her concern in MyChart message. Pt request call soon due to pain.

## 2019-02-10 ENCOUNTER — Telehealth: Payer: Self-pay

## 2019-02-10 NOTE — Telephone Encounter (Signed)
Patient called stating that her vulva pain has increased. Patient spoke with Dr. Ihor Dow last night on the phone and Dr. Linard Millers recommended she restart the gabapentin, try coconut oil, and continue clobestol. Patient states she is in extreme pain. Recommended that she go hospital for evaluation. Patient states she doesn't really want to go and is upset that her doctor is going out of town and there is no one to help her.   Assured patient if she was calling at this time on any Friday we would recommend she go to the hospital as we close at noon. Offered to place patient on the schedule Monday but she states if the pain is this bad throughout the day she will have to go to the hospital. Patient states she will try another gabapentin this morning to see if it gets better and if not will go to the hospital. Kathrene Alu RN

## 2019-02-10 NOTE — Telephone Encounter (Signed)
Pt called this morning stating her pain is unresolved and increased since starting back Gabapentin as instructed last night by provider via phone. Pt states please advise if she should increase gabapentin until she can be seen for appt.

## 2019-03-01 ENCOUNTER — Ambulatory Visit: Payer: BLUE CROSS/BLUE SHIELD | Admitting: Obstetrics & Gynecology

## 2019-03-09 ENCOUNTER — Ambulatory Visit: Payer: BLUE CROSS/BLUE SHIELD | Admitting: Obstetrics & Gynecology

## 2020-06-23 NOTE — Progress Notes (Signed)
06/23/2020 Autumn Beasley [id number] [birth year]   Chief Complaint:  Lower abdominal pain, diarrhea and rectal bleeding   History of Present Illness: Autumn Beasley is a 44 year old female with a past medical history of asthma, Lyme disease 2017, migraine headaches,  IBS, hemorrhoids and GERD. 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 Cristal Deer in [place], [place]. Past hysterectomy.   She was last seen in our office by Dr. Hilarie Fredrickson on 12/31/2017, her IBS-D symptoms were well controlled at that time. She underwent a colonoscopy 11/04/2017 which identified one 5mm sessile serrated polyp which was removed from the transverse colon. Colon biopsies were negative of microscopic colitis. She was treated with Xifaxan 550mg  one po tid x 14 days and Levsin PRN and her diarrhea resolved.   She presents today with complaints of having lower abdominal pain and diarrhea. She is passing 1 to 8 mustard colored watery to mud like stools daily for the past 3 months. Her BMs are urgent in the am. She is seeing bright red blood with each BM with associated anal burning cut glasss like pain. Sometimes sees strands of blood floating in the toilet water. She is having central and lower abdominal pain and cramping which comes and goes but sometimes lasts all day. No fever. Eating sometimes worsens her abdominal pain, food feel heavy like a rock. No N/V. Her husband has gluten sensitivity therefore she eats less gluten products at home. She describes having random episodes of choking when eating solid foods. Her upper esophagus feels tight, food doesn't pass down easily which started one year ago and has progressively worsened over the past few months. She drinks water or waits a few minutes and the food eventually passes down the esophagus. She does not cough or gag to expel the stuck food. Yesterday, she was eating chicken and rice which triggered upper esophageal/ches pain for 1  hour. She reports having voice hoarseness. Sister with history of Crohn's disease diagnosed in her 76's, she required small bowel surgery.    Colonoscopy 11/04/2017: - The examined portion of the ileum was normal. - One 8 mm sessile serrated polyp in the transverse colon, removed with a cold snare. - Small external and internal hemorrhoids. - Biopsies were taken with a cold forceps from the right colon and left colon for evaluation of microscopic colitis. - Recall colonoscopy 3 years due to the size of the removed polyp and young age.  1. Surgical [P], random right and left colon sites - BENIGN COLONIC MUCOSA. - NO ACTIVE INFLAMMATION OR EVIDENCE OF MICROSCOPIC COLITIS. - NO DYSPLASIA OR MALIGNANCY. 2. Surgical [P], transverse, polyp - SESSILE SERRATED POLYP (X4 FRAGMENTS). - NO DYSPLASIA OR MALIGNANCY.   Review of systems: See HPI otherwise negative.   Current Outpatient Medications on File Prior to Visit  Medication Sig Dispense Refill   DULoxetine (CYMBALTA) 20 MG capsule Take 20 mg by mouth daily.     gabapentin (NEURONTIN) 100 MG capsule 1 capsule three times a day for 1 week, then 2 capsules three times a day for 1 week then 3 capsules 3 times daily 126 capsule 1   hydroxychloroquine (PLAQUENIL) 200 MG tablet Take by mouth daily.     hyoscyamine (LEVSIN SL) 0.125 MG SL tablet Place 1 tablet (0.125 mg total) under the tongue every 4 (four) hours as needed. 60 tablet 3   tiZANidine (ZANAFLEX) 4 MG tablet Take 4 mg by mouth every 6 (six) hours as needed  for muscle spasms.     topiramate (TOPAMAX) 100 MG tablet Take 100 mg by mouth daily.      No current facility-administered medications on file prior to visit.    Allergies  Allergen Reactions   Morphine And Related    Penicillins Rash    Has patient had a PCN reaction causing immediate rash, facial/tongue/throat swelling, SOB or lightheadedness with hypotension: No Has patient had a PCN reaction causing severe rash  involving mucus membranes or skin necrosis: No Has patient had a PCN reaction that required hospitalization No Has patient had a PCN reaction occurring within the last 10 years: No If all of the above answers are "NO", then may proceed with Cephalosporin use.      Current Medications, Allergies, Past Medical History, Past Surgical History, Family History and Social History were reviewed in [employer] record.   Physical Exam: BP 102/76    Pulse 65    Ht 5\' 3"  (1.6 m)    Wt 183 lb (83 kg)    BMI 32.42 kg/m   General: Well developed 44 year old female in no acute distress. Head: Normocephalic and atraumatic. Eyes: No scleral icterus. Conjunctiva pink . Ears: Normal auditory acuity. Mouth: Dentition intact. No ulcers or lesions.  Lungs: Clear throughout to auscultation. Heart: Regular rate and rhythm, no murmur. Abdomen: Soft, nontender and nondistended. No masses or hepatomegaly. Normal bowel sounds x 4 quadrants.  Rectal: Large 8 - 38mm gaping open left posterior fissure with a smaller right posterior fissure vs fistula opening. Tender. No active bleeding. No exudate. Internal hemorrhoids without prolapse or obvious bleeding.  Musculoskeletal: Symmetrical with no gross deformities. Extremities: No edema. Neurological: Alert oriented x 4. No focal deficits.  Psychological: Alert and cooperative. Normal mood and affect  Assessment and Recommendations:  53. 44 year old female with diarrhea and central/lower abdominal pain. Family history (sister) of Crohn's disease.  -CBC, CMP, tTG, IgA and CRP -GI pathogen panel -Diagnostic colonoscopy benefits and risks discussed including risk with sedation, risk of bleeding, perforation and infection  -Patient to call our office if her symptoms worsen. Discussed scheduling an abd/pelvic CT if her abdominal pain worsens.  -Push fluids, bland diet.  -Hyoscyamine 0.125mg  one tab SL tid PRN  2. Rectal bleeding most likely due  to a large posterior fissure -Diltiazem 2%/Lidocaine 2% fissure cream apply inside and to the external anal area tid x 6 weeks  -Sitz bath bid PRN -See plan in # 1  3. History of an 27mm sessile serrated polyp removed at the time of her colonoscopy 10/2017 -See plan in # 1  4. History of GERD with intermittent dysphagia which has progressively worsened over the past year -Omeprazole 40mg  once  daily -EGD benefits and risks discussed including risk with sedation, risk of bleeding, perforation and infection   Further follow up to be determined after the above evaluation completed

## 2020-06-24 ENCOUNTER — Ambulatory Visit: Payer: BC Managed Care – PPO | Admitting: Nurse Practitioner

## 2020-06-24 ENCOUNTER — Other Ambulatory Visit: Payer: Self-pay | Admitting: Nurse Practitioner

## 2020-06-24 ENCOUNTER — Encounter: Payer: Self-pay | Admitting: Nurse Practitioner

## 2020-06-24 ENCOUNTER — Other Ambulatory Visit (INDEPENDENT_AMBULATORY_CARE_PROVIDER_SITE_OTHER): Payer: BC Managed Care – PPO

## 2020-06-24 VITALS — BP 102/76 | HR 65 | Ht 63.0 in | Wt 183.0 lb

## 2020-06-24 DIAGNOSIS — Z86010 Personal history of colonic polyps: Secondary | ICD-10-CM

## 2020-06-24 DIAGNOSIS — R197 Diarrhea, unspecified: Secondary | ICD-10-CM

## 2020-06-24 DIAGNOSIS — R109 Unspecified abdominal pain: Secondary | ICD-10-CM

## 2020-06-24 DIAGNOSIS — K602 Anal fissure, unspecified: Secondary | ICD-10-CM

## 2020-06-24 DIAGNOSIS — R1310 Dysphagia, unspecified: Secondary | ICD-10-CM | POA: Diagnosis not present

## 2020-06-24 DIAGNOSIS — K625 Hemorrhage of anus and rectum: Secondary | ICD-10-CM

## 2020-06-24 DIAGNOSIS — K219 Gastro-esophageal reflux disease without esophagitis: Secondary | ICD-10-CM

## 2020-06-24 DIAGNOSIS — A09 Infectious gastroenteritis and colitis, unspecified: Secondary | ICD-10-CM

## 2020-06-24 LAB — COMPREHENSIVE METABOLIC PANEL
ALT: 17 U/L (ref 0–35)
AST: 17 U/L (ref 0–37)
Albumin: 4.7 g/dL (ref 3.5–5.2)
Alkaline Phosphatase: 45 U/L (ref 39–117)
BUN: 16 mg/dL (ref 6–23)
CO2: 26 mEq/L (ref 19–32)
Calcium: 9.8 mg/dL (ref 8.4–10.5)
Chloride: 108 mEq/L (ref 96–112)
Creatinine, Ser: 1.02 mg/dL (ref 0.40–1.20)
GFR: 59.12 mL/min — ABNORMAL LOW (ref >60.00)
Glucose, Bld: 99 mg/dL (ref 70–99)
Potassium: 4.5 mEq/L (ref 3.5–5.1)
Sodium: 140 mEq/L (ref 135–145)
Total Bilirubin: 0.5 mg/dL (ref 0.2–1.2)
Total Protein: 7.5 g/dL (ref 6.0–8.3)

## 2020-06-24 LAB — CBC WITH DIFFERENTIAL/PLATELET
Basophils Absolute: 0 10*3/uL (ref 0.0–0.1)
Basophils Relative: 1 % (ref 0.0–3.0)
Eosinophils Absolute: 0.1 10*3/uL (ref 0.0–0.7)
Eosinophils Relative: 3.2 % (ref 0.0–5.0)
HCT: 47.3 % — ABNORMAL HIGH (ref 36.0–46.0)
Hemoglobin: 16 g/dL — ABNORMAL HIGH (ref 12.0–15.0)
Lymphocytes Relative: 34.3 % (ref 12.0–46.0)
Lymphs Abs: 1.6 10*3/uL (ref 0.7–4.0)
MCHC: 33.8 g/dL (ref 30.0–36.0)
MCV: 88.6 fl (ref 78.0–100.0)
Monocytes Absolute: 0.3 10*3/uL (ref 0.1–1.0)
Monocytes Relative: 6.8 % (ref 3.0–12.0)
Neutro Abs: 2.5 10*3/uL (ref 1.4–7.7)
Neutrophils Relative %: 54.7 % (ref 43.0–77.0)
Platelets: 250 10*3/uL (ref 150.0–400.0)
RBC: 5.34 Mil/uL — ABNORMAL HIGH (ref 3.87–5.11)
RDW: 13.6 % (ref 11.5–15.5)
WBC: 4.5 10*3/uL (ref 4.0–10.5)

## 2020-06-24 LAB — IGA: IgA: 312 mg/dL (ref 68–378)

## 2020-06-24 LAB — C-REACTIVE PROTEIN: CRP: <1 mg/dL (ref 0.5–20.0)

## 2020-06-24 MED ORDER — AMBULATORY NON FORMULARY MEDICATION: 1 refills | Status: DC

## 2020-06-24 MED ORDER — OMEPRAZOLE 40 MG PO CPDR
40.0000 mg | DELAYED_RELEASE_CAPSULE | Freq: Every day | ORAL | 0 refills | Status: DC
Start: 2020-06-24 — End: 2020-06-24

## 2020-06-24 MED ORDER — NA SULFATE-K SULFATE-MG SULF 17.5-3.13-1.6 GM/177ML PO SOLN: 1.0000 | Freq: Once | ORAL | 0 refills | Status: AC

## 2020-06-24 NOTE — Patient Instructions (Signed)
If you are age 43 or older, your body mass index should be between 23-30. Your Body mass index is 32.42 kg/m. If this is out of the aforementioned range listed, please consider follow up with your Primary Care Provider.  If you are age 22 or younger, your body mass index should be between 19-25. Your Body mass index is 32.42 kg/m. If this is out of the aformentioned range listed, please consider follow up with your Primary Care Provider.   Your provider has requested that you go to the basement level for lab work before leaving today. Press "B" on the elevator. The lab is located at the first door on the left as you exit the elevator.  We have sent the following medications to your pharmacy for you to pick up at your convenience: [facility] Omeprazole and Suprep for colonoscopy  Please purchase the following medications over the counter and take as directed: Florastor probiotic 1 capsule twice daily    Diltiazem. Gel to [place]  [facility] information is below: Address: [address], [place], [place] [address]  [phone]  *Please DO NOT go directly from our office to pick up this medication! Give the pharmacy 1 day to process the prescription as this is compounded and takes time to make.   Due to recent changes in healthcare laws, you may see the results of your imaging and laboratory studies on MyChart before your provider has had a chance to review them.  We understand that in some cases there may be results that are confusing or concerning to you. Not all laboratory results come back in the same time frame and the provider may be waiting for multiple results in order to interpret others.  Please give [place] 48 hours in order for your provider to thoroughly review all the results before contacting the office for clarification of your results.

## 2020-06-25 ENCOUNTER — Telehealth: Payer: Self-pay | Admitting: General Surgery

## 2020-06-25 DIAGNOSIS — R718 Other abnormality of red blood cells: Secondary | ICD-10-CM

## 2020-06-25 DIAGNOSIS — K602 Anal fissure, unspecified: Secondary | ICD-10-CM | POA: Insufficient documentation

## 2020-06-25 DIAGNOSIS — K5792 Diverticulitis of intestine, part unspecified, without perforation or abscess without bleeding: Secondary | ICD-10-CM

## 2020-06-25 DIAGNOSIS — K625 Hemorrhage of anus and rectum: Secondary | ICD-10-CM | POA: Insufficient documentation

## 2020-06-25 DIAGNOSIS — K529 Noninfective gastroenteritis and colitis, unspecified: Secondary | ICD-10-CM

## 2020-06-25 DIAGNOSIS — R197 Diarrhea, unspecified: Secondary | ICD-10-CM | POA: Insufficient documentation

## 2020-06-25 LAB — TISSUE TRANSGLUTAMINASE, IGA: (tTG) Ab, IgA: 1 U/mL

## 2020-06-25 NOTE — Telephone Encounter (Signed)
Notified the patient orders have been placed for lab work for 07/26/2020 she will need to come to the lab at [facility]. Patient verbalized understanding.  Patient is having tremendous pain and would like to know if she should have the CT scheduled before she leaves on her vacation. She states she is almost ready to cancel due to the discomfort and concern with diarrhea and pain. She may want to do her ECL earlier. Please advise.

## 2020-06-25 NOTE — Telephone Encounter (Signed)
Contacted the patient with recommendations from [place], NP. The patient stated she did not feel that an emergency room visit was necessary at this time. She opted for [place] to schedule a ct abd/pelvis with oral and IV contrast. The patient would like this to be scheduled at [facility] if they can get her in the quickest since she lives in [place].

## 2020-06-25 NOTE — Telephone Encounter (Signed)
Autumn Beasley if she is have severe abdominal pain she should go to the local ER. If she is stable, no fever, in no distress then please schedule a start abd/pelvic CT with oral and IV contrast.  Rule out diverticulitis and colitis. THX

## 2020-06-25 NOTE — Telephone Encounter (Signed)
Scheduled the patient for CT abdomen and pelvis with contrast at [facility] 06/27/2020 arrive at 4:15. Patient instructed to go to [facility] radiology to pick up her contrast and instructions for her CT. Patient verbalized understanding and agreed to pick up instructions and contrast.

## 2020-06-25 NOTE — Telephone Encounter (Signed)
-----   Message from Noralyn Pick, NP sent at 06/25/2020 11:31 AM EDT ----- Olivia Mackie, can you provide the patient with a lab order to complete in 4 weeks to recheck CBC to include iron, iron saturation and ferritin level. DX: elevated RBC count. Thx

## 2020-06-26 LAB — GI PROFILE, STOOL, PCR

## 2020-06-27 ENCOUNTER — Other Ambulatory Visit: Payer: Self-pay

## 2020-06-27 ENCOUNTER — Ambulatory Visit (HOSPITAL_COMMUNITY)
Admission: RE | Admit: 2020-06-27 | Discharge: 2020-06-27 | Disposition: A | Discharge status: Home / Self Care | Payer: BC Managed Care – PPO | Source: Ambulatory Visit | Attending: Nurse Practitioner | Admitting: Nurse Practitioner

## 2020-06-27 ENCOUNTER — Encounter (HOSPITAL_COMMUNITY): Payer: Self-pay

## 2020-06-27 DIAGNOSIS — K5792 Diverticulitis of intestine, part unspecified, without perforation or abscess without bleeding: Secondary | ICD-10-CM

## 2020-06-27 DIAGNOSIS — K529 Noninfective gastroenteritis and colitis, unspecified: Secondary | ICD-10-CM | POA: Insufficient documentation

## 2020-06-27 MED ORDER — IOHEXOL 350 MG/ML SOLN
100.0000 mL | Freq: Once | INTRAVENOUS | Status: AC | PRN
  Administered 2020-06-27: 100 mL via INTRAVENOUS

## 2020-06-29 ENCOUNTER — Other Ambulatory Visit: Payer: Self-pay

## 2020-06-29 ENCOUNTER — Emergency Department (HOSPITAL_COMMUNITY)
Admission: EM | Admit: 2020-06-29 | Discharge: 2020-06-29 | Disposition: A | Discharge status: Home / Self Care | Payer: BC Managed Care – PPO | Attending: Emergency Medicine | Admitting: Emergency Medicine

## 2020-06-29 ENCOUNTER — Encounter (HOSPITAL_COMMUNITY): Payer: Self-pay | Admitting: *Deleted

## 2020-06-29 DIAGNOSIS — R1030 Lower abdominal pain, unspecified: Secondary | ICD-10-CM | POA: Diagnosis not present

## 2020-06-29 DIAGNOSIS — K219 Gastro-esophageal reflux disease without esophagitis: Secondary | ICD-10-CM | POA: Diagnosis not present

## 2020-06-29 DIAGNOSIS — Z79899 Other long term (current) drug therapy: Secondary | ICD-10-CM | POA: Insufficient documentation

## 2020-06-29 DIAGNOSIS — J45909 Unspecified asthma, uncomplicated: Secondary | ICD-10-CM | POA: Diagnosis not present

## 2020-06-29 DIAGNOSIS — R112 Nausea with vomiting, unspecified: Secondary | ICD-10-CM | POA: Insufficient documentation

## 2020-06-29 DIAGNOSIS — R197 Diarrhea, unspecified: Secondary | ICD-10-CM

## 2020-06-29 DIAGNOSIS — R531 Weakness: Secondary | ICD-10-CM | POA: Insufficient documentation

## 2020-06-29 LAB — COMPREHENSIVE METABOLIC PANEL
ALT: 87 U/L — ABNORMAL HIGH (ref 0–44)
AST: 55 U/L — ABNORMAL HIGH (ref 15–41)
Albumin: 4.3 g/dL (ref 3.5–5.0)
Alkaline Phosphatase: 51 U/L (ref 38–126)
Anion gap: 9 (ref 5–15)
BUN: 6 mg/dL (ref 6–20)
CO2: 22 mmol/L (ref 22–32)
Calcium: 9.3 mg/dL (ref 8.9–10.3)
Chloride: 108 mmol/L (ref 98–111)
Creatinine, Ser: 0.94 mg/dL (ref 0.44–1.00)
GFR calc Af Amer: >60 mL/min (ref >60)
GFR calc non Af Amer: >60 mL/min (ref >60)
Glucose, Bld: 98 mg/dL (ref 70–99)
Potassium: 4.2 mmol/L (ref 3.5–5.1)
Sodium: 139 mmol/L (ref 135–145)
Total Bilirubin: 0.5 mg/dL (ref 0.3–1.2)
Total Protein: 7.3 g/dL (ref 6.5–8.1)

## 2020-06-29 LAB — URINALYSIS, ROUTINE W REFLEX MICROSCOPIC
Bilirubin Urine: NEGATIVE
Glucose, UA: NEGATIVE mg/dL
Hgb urine dipstick: NEGATIVE
Ketones, ur: NEGATIVE mg/dL
Leukocytes,Ua: NEGATIVE
Nitrite: NEGATIVE
Protein, ur: NEGATIVE mg/dL
Specific Gravity, Urine: 1.012 (ref 1.005–1.030)
pH: 5 (ref 5.0–8.0)

## 2020-06-29 LAB — CBC
HCT: 46.9 % — ABNORMAL HIGH (ref 36.0–46.0)
Hemoglobin: 15.4 g/dL — ABNORMAL HIGH (ref 12.0–15.0)
MCH: 29.7 pg (ref 26.0–34.0)
MCHC: 32.8 g/dL (ref 30.0–36.0)
MCV: 90.5 fL (ref 80.0–100.0)
Platelets: 248 10*3/uL (ref 150–400)
RBC: 5.18 MIL/uL — ABNORMAL HIGH (ref 3.87–5.11)
RDW: 13.2 % (ref 11.5–15.5)
WBC: 3.9 10*3/uL — ABNORMAL LOW (ref 4.0–10.5)
nRBC: 0 % (ref 0.0–0.2)

## 2020-06-29 LAB — DIFFERENTIAL
Abs Immature Granulocytes: 0.01 10*3/uL (ref 0.00–0.07)
Basophils Absolute: 0 10*3/uL (ref 0.0–0.1)
Basophils Relative: 1 %
Eosinophils Absolute: 0.2 10*3/uL (ref 0.0–0.5)
Eosinophils Relative: 5 %
Immature Granulocytes: 0 %
Lymphocytes Relative: 29 %
Lymphs Abs: 1.1 10*3/uL (ref 0.7–4.0)
Monocytes Absolute: 0.3 10*3/uL (ref 0.1–1.0)
Monocytes Relative: 8 %
Neutro Abs: 2.2 10*3/uL (ref 1.7–7.7)
Neutrophils Relative %: 57 %

## 2020-06-29 LAB — LIPASE, BLOOD: Lipase: 29 U/L (ref 11–51)

## 2020-06-29 LAB — POC URINE PREG, ED: Preg Test, Ur: NEGATIVE

## 2020-06-29 MED ORDER — PROMETHAZINE HCL 25 MG/ML IJ SOLN
12.5000 mg | Freq: Once | INTRAMUSCULAR | Status: AC
  Administered 2020-06-29: 12.5 mg via INTRAVENOUS
  Filled 2020-06-29: qty 1

## 2020-06-29 MED ORDER — HYOSCYAMINE SULFATE SL 0.125 MG SL SUBL
0.1250 mg | SUBLINGUAL_TABLET | SUBLINGUAL | 0 refills | Status: AC | PRN
Start: 2020-06-29 — End: ?

## 2020-06-29 MED ORDER — PROMETHAZINE HCL 25 MG PO TABS
25.0000 mg | ORAL_TABLET | Freq: Four times a day (QID) | ORAL | 0 refills | Status: DC | PRN
Start: 2020-06-29 — End: 2022-10-22

## 2020-06-29 MED ORDER — SODIUM CHLORIDE 0.9% FLUSH
3.0000 mL | Freq: Once | INTRAVENOUS | Status: AC
  Administered 2020-06-29: 3 mL via INTRAVENOUS

## 2020-06-29 MED ORDER — SODIUM CHLORIDE 0.9 % IV BOLUS
1000.0000 mL | Freq: Once | INTRAVENOUS | Status: AC
  Administered 2020-06-29: 1000 mL via INTRAVENOUS

## 2020-06-29 NOTE — ED Triage Notes (Signed)
Pt c/o lower abdominal pain that started yesterday with diarrhea today and nausea

## 2020-06-29 NOTE — Discharge Instructions (Addendum)
Your lab tests tonight are reassuring with no obvious source of your symptoms found. Your liver enzymes are borderline elevated and should be rechecked within the next week - call your GI specialist to advise. (This should not be the source of your lower abdominal pain).  Use the medicines prescribed for symptom relief.  Get rechecked for any new or worsened symptoms.

## 2020-06-29 NOTE — ED Provider Notes (Signed)
[facility] EMERGENCY DEPARTMENT Provider Note   CSN: [id number] Arrival date & time: 06/29/20  1310     History Chief Complaint  Patient presents with  . Diarrhea    Autumn Beasley is a 44 y.o. 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 She describes 5-8 watery to loose mustard colored stools daily for the past 3 months and is associated with lower abdominal pain and cramping. She does see frequent blood on the toilet tissue when wiping. She endorses that eating is exacerbating her lower abdominal pain. She last ate a small serving of applesauce yesterday which triggered pain and diarrhea and has had no p.o. intake today. She had a CT scan of her abdomen pelvis 2 days ago, ordered by GI which was negative for acute findings. She presents here specifically secondary to increasing weakness, lightheadedness when she stands and is concerned for being dehydrated. She denies fevers or chills. She endorses nausea without emesis although does endorse several episodes of dry heaving yesterday. Per the chart she had similar symptoms in 2018 at which time she underwent a colonoscopy which was positive for a solitary polyp. At that time she was treated with Xifaxan and Levsin as needed and her symptoms resolved. She is scheduled for a colonoscopy in September. Family history is significant with a sister with Crohn's disease.  The history is provided by the patient.       Past Medical History:  Diagnosis Date  . Asthma   . CREST syndrome ([place])   . External hemorrhoids   . Gastropathy   . GERD (gastroesophageal reflux disease)   . Hemorrhoids   . IBS (irritable bowel syndrome)   . Internal hemorrhoids   . Lyme disease   . Migraine     Patient Active Problem List    Diagnosis Date Noted  . Diarrhea 06/25/2020  . Rectal bleeding 06/25/2020  . Anal fissure 06/25/2020  . Periumbilical abdominal pain 02/13/2015    Past Surgical History:  Procedure Laterality Date  . ABDOMINAL HYSTERECTOMY    . CHOLECYSTECTOMY    . INCONTINENCE SURGERY    . TONSILLECTOMY       OB History    Gravida  4   Para  2   Term  2   Preterm      AB  2   Living  2     SAB  2   TAB      Ectopic      Multiple      Live Births  2           Family History  Problem Relation Age of Onset  . Diabetes Maternal Aunt   . Diabetes Maternal Grandmother   . Alcohol abuse Maternal Grandfather   . Crohn's disease Sister   . Colon polyps Sister   . Liver disease Maternal Aunt   . COPD Maternal Aunt   . Colon cancer Neg Hx   . Rectal cancer Neg Hx   . Stomach cancer Neg Hx   . Esophageal cancer Neg Hx     Social History   Tobacco Use  . Smoking status: Never Smoker  . Smokeless tobacco: Never Used  Vaping Use  . Vaping Use: Never used  Substance Use Topics  . Alcohol use: No  .  Drug use: No    Home Medications Prior to Admission medications   Medication Sig Start Date End Date Taking? Authorizing Provider  AMBULATORY NON FORMULARY MEDICATION Diltiazem/lidocaine gel 2% anal fissure cream. Apply a small amount inside the anal area three times daily for 6 weeks 06/24/20   Noralyn Pick, NP  DULoxetine (CYMBALTA) 20 MG capsule Take 20 mg by mouth daily.    [provider]  gabapentin (NEURONTIN) 100 MG capsule 1 capsule three times a day for 1 week, then 2 capsules three times a day for 1 week then 3 capsules 3 times daily 12/13/18   Florian Buff, MD  hydroxychloroquine (PLAQUENIL) 200 MG tablet Take by mouth daily.    [provider]  Hyoscyamine Sulfate SL (LEVSIN/SL) 0.125 MG SUBL Place 0.125 mg under the tongue every 4 (four) hours as needed (abdominal pain). 06/29/20   Evalee Jefferson, PA-C  omeprazole (PRILOSEC) 40 MG  capsule TAKE 1 CAPSULE(40 MG) BY MOUTH DAILY 06/24/20   Noralyn Pick, NP  promethazine (PHENERGAN) 25 MG tablet Take 1 tablet (25 mg total) by mouth every 6 (six) hours as needed for nausea or vomiting. 06/29/20   Norine Reddington, Almyra Free, PA-C  tiZANidine (ZANAFLEX) 4 MG tablet Take 4 mg by mouth every 6 (six) hours as needed for muscle spasms.    [provider]  topiramate (TOPAMAX) 100 MG tablet Take 100 mg by mouth daily.     [provider]    Allergies    Morphine and related and Penicillins  Review of Systems   Review of Systems  Constitutional: Negative for chills and fever.  HENT: Negative for congestion and sore throat.   Eyes: Negative.   Respiratory: Negative for chest tightness and shortness of breath.   Cardiovascular: Negative for chest pain.  Gastrointestinal: Positive for abdominal pain, diarrhea, nausea and vomiting.  Genitourinary: Negative.   Musculoskeletal: Negative for arthralgias, joint swelling and neck pain.  Skin: Negative.  Negative for rash and wound.  Neurological: Positive for weakness. Negative for dizziness, light-headedness, numbness and headaches.  Psychiatric/Behavioral: Negative.     Physical Exam Updated Vital Signs BP (!) 135/100   Pulse 71   Temp 98.9 F (37.2 C)   Resp 17   Ht 5\' 3"  (1.6 m)   Wt 81.6 kg   SpO2 100%   BMI 31.89 kg/m   Physical Exam  ED Results / Procedures / Treatments   Labs (all labs ordered are listed, but only abnormal results are displayed) Labs Reviewed  COMPREHENSIVE METABOLIC PANEL - Abnormal; Notable for the following components:      Result Value   AST 55 (*)    ALT 87 (*)    All other components within normal limits  CBC - Abnormal; Notable for the following components:   WBC 3.9 (*)    RBC 5.18 (*)    Hemoglobin 15.4 (*)    HCT 46.9 (*)    All other components within normal limits  LIPASE, BLOOD  URINALYSIS, ROUTINE W REFLEX MICROSCOPIC  DIFFERENTIAL  POC URINE PREG, ED     EKG None  Radiology No results found.  Procedures Procedures (including critical care time)  Medications Ordered in ED Medications  sodium chloride flush (NS) 0.9 % injection 3 mL (3 mLs Intravenous Given 06/29/20 1811)  sodium chloride 0.9 % bolus 1,000 mL (0 mLs Intravenous Stopped 06/29/20 1944)  promethazine (PHENERGAN) injection 12.5 mg (12.5 mg Intravenous Given 06/29/20 1810)    ED Course  I  have reviewed the triage vital signs and the nursing notes.  Pertinent labs & imaging results that were available during my care of the patient were reviewed by me and considered in my medical decision making (see chart for details).    MDM Rules/Calculators/A&P                          Pt with chronic lower abd pain with n/v/d under the care of [facility]. Concern for dehydration, although labs reflect good hydration status, she was given IV fluids, also given phenergan which seems to provide better nausea relief than zofran.  She tolerated PO trial here.  Labs reviewed and discussed including bump in LFT's of unclear etiology, no RUQ pain, pt denies excess tylenol use, no etoh, s/p cholecystectomy. Advised to let her GI specialist know of this finding, consider recheck of these labs in a week to ensure not continuing to rise.  CT imaging from 2 days ago reviewed and negative for acute findings.  No acute abd findings on todays exam.  Prescribed phenergan, also prescribed levsin. For cramping. Return precautions outlined.  With prior similar episode she was also placed on Xifaxan which was helpful. Advised pt to discuss with her GI specialist re stating this medicine.  The patient appears reasonably screened and/or stabilized for discharge and I doubt any other medical condition or other [facility] requiring further screening, evaluation, or treatment in the ED at this time prior to discharge.  Final Clinical Impression(s) / ED Diagnoses Final diagnoses:  Nausea vomiting and diarrhea  Lower  abdominal pain    Rx / DC Orders ED Discharge Orders         Ordered    promethazine (PHENERGAN) 25 MG tablet  Every 6 hours PRN     Discontinue  Reprint     06/29/20 2041    Hyoscyamine Sulfate SL (LEVSIN/SL) 0.125 MG SUBL  Every 4 hours PRN     Discontinue  Reprint     06/29/20 2044           Evalee Jefferson, PA-C 06/30/20 1148    Noemi Chapel, MD 07/03/20 1323

## 2020-07-02 ENCOUNTER — Telehealth: Payer: Self-pay | Admitting: Nurse Practitioner

## 2020-07-02 DIAGNOSIS — R197 Diarrhea, unspecified: Secondary | ICD-10-CM

## 2020-07-02 DIAGNOSIS — K529 Noninfective gastroenteritis and colitis, unspecified: Secondary | ICD-10-CM

## 2020-07-02 NOTE — Telephone Encounter (Signed)
Patient is calling to follow up on previous message please advise is seeking help

## 2020-07-02 NOTE — Telephone Encounter (Signed)
This patient was seen by Carl Best for anal fissure, diarrhea and abdominal pain on 06/24/20. She had labs. She had a CT. Labs show elevated RBC count for which she will have irob studies in 3 weeks. CT did not show anything.  Patient developed severe nausea and vomiting as well as diarrhea. Went to the ED. She was given fluids and anti-emetics. Labs were about the same. Mild elevation in LFT. She had vomiting yesterday. Today she is tolerating PO fluids and is very focused on maintaining hydration. She has not had to take any anti-emetic. She has had diarrhea x1 and no other passage of stool. Her main complaint is the pain from the fissure. She is using Diltiazem/lidocaine compound to the rectum 3+ times daily. She is reapplying after visiting the toilet. She is too uncomfortable to sit. Is there anything else that can be done to make her more comfortable in regards to the fissures?

## 2020-07-03 NOTE — Telephone Encounter (Signed)
Patient reports that she is not able to eat due to the diarrhea.  She does not eat due to the diarrhea.  She is asking for something to help control the diarrhea.  She does not have diarrhea if she does not eat.  She has only been drinking fluids .  Please advise.

## 2020-07-03 NOTE — Telephone Encounter (Signed)
Pt is insisting on obtaining a response asap. Pt is not wanting to wait for a provider to respond since she is in discomfort now

## 2020-07-03 NOTE — Telephone Encounter (Signed)
Continue diltiazem 2% get TID for anal fissure, it may be best to separate the diltiazem from the lidocaine (so that she can use them separately and use the lidocaine in the form of RectiCare more frequently) RectiCare if the most potent OTC analgesic for fissure related pain, would continue this per box instruction for pain Infectious stool pain was negative, and per Beth's note the diarrhea has not persisted If still too much pain to sit, she could be dealing with a thrombosed hemorrhoid and if so, then would consider [facility] referral for evaluation by Dr. Eliezer Mccoy, [place] She should return this week for CBC, CMP given recent lab abnormalities seen during ER visit OTC Tylenol per box instruction may also provide some pain relief

## 2020-07-03 NOTE — Telephone Encounter (Signed)
Dr. Hilarie Fredrickson see message from [facility] and patient

## 2020-07-04 MED ORDER — DIPHENOXYLATE-ATROPINE 2.5-0.025 MG PO TABS
1.0000 | ORAL_TABLET | Freq: Four times a day (QID) | ORAL | 0 refills | Status: DC | PRN
Start: 2020-07-04 — End: 2022-10-22

## 2020-07-04 NOTE — Telephone Encounter (Signed)
Lomotil 1 tab QIDPRN Infectious panel negative Colonoscopy is pending

## 2020-07-04 NOTE — Telephone Encounter (Signed)
Patient notified She reports no relief at all from the [place]. Referral initiated to [facility] to DOD for potential thrombosed hemorrhoid and severe rectal pain. She will see Dr. Johney Maine in their [facility] office on 07/09/20 10:15 arrival for 10:15 appt.  Rx sent for lomotil

## 2020-07-09 ENCOUNTER — Telehealth: Payer: Self-pay | Admitting: Nurse Practitioner

## 2020-07-09 ENCOUNTER — Ambulatory Visit: Payer: Self-pay | Admitting: Surgery

## 2020-07-09 ENCOUNTER — Other Ambulatory Visit: Payer: Self-pay

## 2020-07-09 DIAGNOSIS — K580 Irritable bowel syndrome with diarrhea: Secondary | ICD-10-CM | POA: Insufficient documentation

## 2020-07-09 MED ORDER — RIFAXIMIN 550 MG PO TABS: 550.0000 mg | ORAL_TABLET | Freq: Three times a day (TID) | ORAL | 0 refills | Status: AC

## 2020-07-09 NOTE — Telephone Encounter (Signed)
Spoke with the patient. She was seen this morning by Dr Johney Maine. He diagnosed anal fissure. He told the patient that getting the diarrhea under control will help her heal. He also told her if she were to need surgery that she would need to have the diarrhea under control first. He asked her to ask about Xifaxan for her. She is presently taking Lomotil. She says her stomach cramps despite the Lomotil.  She will be getting follow up labs in 2 weeks to follow up the iron. She wants to know if you also want to follow up the mildly elevated LFT found at the recent ED visit. Her colonoscopy is scheduled for 08/15/20.  No earlier openings. Please advise.

## 2020-07-09 NOTE — Telephone Encounter (Signed)
Beth, pls contact the patient. Her GI pathogen panel was negative. It is ok to try Xifaxan at this time since her colonoscopy is not until 08/15/2020. Please send RX for Xifaxan 550mg  one po tid x 14 days # 42, no refill. Diagnosis: IBS-D. I would also recommend for her to take Florastor  probiotic one po bid x 4 weeks if affordable. If her symptoms worsen we may need to expedite her colonoscopy. She had a pretty large anal fissure when I saw her as well. Hopefully her fissure will heal, however, I suspect she may need an eventual sphincterotomy. Patient to call our office if her diarrhea or anal pain/bleeding worsen. Thank you.

## 2020-07-09 NOTE — Telephone Encounter (Signed)
Prescription to [facility]. The patient will let me know if the co-pay is expensive.

## 2020-07-15 NOTE — Telephone Encounter (Signed)
Pt is requesting a call back from a nurse regarding xifaxan pre authorization. Pt wants a pre authorization to be started with her insurance if possible.  [place] [phone]

## 2020-07-15 NOTE — Telephone Encounter (Signed)
Patients info is as follows: BIN: [id number] PCN: [id number] GROUP: [id number] ID: [id number]    Weston Settle ([employer]: [employer]) Xifaxan 550MG  tablets   Form [employer] Electronic Prior Authorization Form (2017 NCPDP) Created 11 minutes ago Sent to Plan 9 minutes ago Plan Response 9 minutes ago Submit Clinical Questions 8 minutes ago Determination Favorable 5 minutes ago Message from Plan Request Reference Number: [id number]. XIFAXAN TAB 550MG  is approved through 07/29/2020. Your patient may now fill this prescription and it will be covered.

## 2020-07-29 NOTE — Progress Notes (Signed)
Addendum: Reviewed and agree with assessment and management plan. Cleofas Hudgins M, MD  

## 2020-08-09 ENCOUNTER — Encounter: Payer: Self-pay | Admitting: Internal Medicine

## 2020-08-15 ENCOUNTER — Encounter: Payer: BC Managed Care – PPO | Admitting: Internal Medicine

## 2020-10-16 ENCOUNTER — Telehealth: Payer: Self-pay | Admitting: *Deleted

## 2020-10-16 NOTE — Telephone Encounter (Signed)
noted 

## 2020-10-16 NOTE — Telephone Encounter (Signed)
Dr. Hilarie Fredrickson, This pt is having an ECL on 11-05-20.  She saw Jaclyn Shaggy in the office on 07-03-20- she had an ECL scheduled in September but had to cancel d/t illness. Per our PV protocol and per Dr. Loletha Carrow, if a patient was seen in the office and their procedure is greater than 3 months past the OV, we are to check with the MD to make sure they are ok proceeding or would like a repeat OV.  How would you like to proceed?  Thanks, [employer]

## 2020-10-16 NOTE — Telephone Encounter (Signed)
Thanks Ok to proceed with procedures as scheduled

## 2020-10-22 ENCOUNTER — Ambulatory Visit (AMBULATORY_SURGERY_CENTER): Payer: Self-pay

## 2020-10-22 VITALS — Ht 63.0 in

## 2020-10-22 DIAGNOSIS — R1310 Dysphagia, unspecified: Secondary | ICD-10-CM

## 2020-10-22 DIAGNOSIS — K625 Hemorrhage of anus and rectum: Secondary | ICD-10-CM

## 2020-10-22 DIAGNOSIS — A09 Infectious gastroenteritis and colitis, unspecified: Secondary | ICD-10-CM

## 2020-10-22 NOTE — Progress Notes (Signed)
Pt verified name, DOB, address and insurance during PV today.   In getting started with PV. Pt discussed upcoming rectal fissure surgery.  She feels it would be best to postpone this until after the surgery scheduled for 12/10.  She notes she has bad days with pain and bleeding and after discussing with her surgeon was told that if she is concerned to consider rescheduling her procedures until after her rectal surgery.  She notes the diarrhea and GERD symptoms have improved and it is believed the bleeding is coming from the fissure.   Per pt's decision, she is cancelling procedures until after surgery and will call to reschedule once she gets a sense of how well she is healing.

## 2020-11-03 ENCOUNTER — Ambulatory Visit: Payer: Self-pay | Admitting: Surgery

## 2020-11-05 ENCOUNTER — Encounter: Payer: BC Managed Care – PPO | Admitting: Internal Medicine

## 2020-11-12 ENCOUNTER — Other Ambulatory Visit: Payer: Self-pay

## 2020-11-12 ENCOUNTER — Other Ambulatory Visit (HOSPITAL_COMMUNITY)
Admission: RE | Admit: 2020-11-12 | Discharge: 2020-11-12 | Disposition: A | Discharge status: Home / Self Care | Payer: BC Managed Care – PPO | Source: Ambulatory Visit | Attending: Surgery | Admitting: Surgery

## 2020-11-12 ENCOUNTER — Encounter (HOSPITAL_BASED_OUTPATIENT_CLINIC_OR_DEPARTMENT_OTHER): Payer: Self-pay | Admitting: Surgery

## 2020-11-12 DIAGNOSIS — K620 Anal polyp: Secondary | ICD-10-CM | POA: Diagnosis not present

## 2020-11-12 DIAGNOSIS — K601 Chronic anal fissure: Secondary | ICD-10-CM | POA: Diagnosis not present

## 2020-11-12 DIAGNOSIS — K648 Other hemorrhoids: Secondary | ICD-10-CM | POA: Diagnosis not present

## 2020-11-12 DIAGNOSIS — Z20822 Contact with and (suspected) exposure to covid-19: Secondary | ICD-10-CM | POA: Insufficient documentation

## 2020-11-12 DIAGNOSIS — Z01812 Encounter for preprocedural laboratory examination: Secondary | ICD-10-CM | POA: Insufficient documentation

## 2020-11-12 DIAGNOSIS — K529 Noninfective gastroenteritis and colitis, unspecified: Secondary | ICD-10-CM | POA: Diagnosis not present

## 2020-11-12 LAB — SARS CORONAVIRUS 2 (TAT 6-24 HRS): SARS Coronavirus 2: NEGATIVE

## 2020-11-12 NOTE — Progress Notes (Signed)
Spoke w/ via phone for pre-op interview--- PT Lab needs dos----  no             Lab results------ no COVID test ------ 11-12-2020 @ 1330 Arrive at ------- 1000 NPO after MN NO Solid Food.  Clear liquids from MN until--- 0900 Medications to take morning of surgery ----- Prilosec Diabetic medication ----- n/a Patient Special Instructions ----- n/a Pre-Op special Istructions ----- n/a Patient verbalized understanding of instructions that were given at this phone interview. Patient denies shortness of breath, chest pain, fever, cough at this phone interview.

## 2020-11-15 ENCOUNTER — Encounter (HOSPITAL_BASED_OUTPATIENT_CLINIC_OR_DEPARTMENT_OTHER): Payer: Self-pay | Admitting: Surgery

## 2020-11-15 ENCOUNTER — Encounter (HOSPITAL_BASED_OUTPATIENT_CLINIC_OR_DEPARTMENT_OTHER)
Admission: RE | Disposition: A | Discharge status: Home / Self Care | Payer: Self-pay | Source: Home / Self Care | Attending: Surgery

## 2020-11-15 ENCOUNTER — Ambulatory Visit (HOSPITAL_BASED_OUTPATIENT_CLINIC_OR_DEPARTMENT_OTHER): Payer: BC Managed Care – PPO | Admitting: Certified Registered"

## 2020-11-15 ENCOUNTER — Other Ambulatory Visit: Payer: Self-pay

## 2020-11-15 ENCOUNTER — Ambulatory Visit (HOSPITAL_BASED_OUTPATIENT_CLINIC_OR_DEPARTMENT_OTHER)
Admission: RE | Admit: 2020-11-15 | Discharge: 2020-11-15 | Disposition: A | Discharge status: Home / Self Care | Payer: BC Managed Care – PPO | Attending: Surgery | Admitting: Surgery

## 2020-11-15 DIAGNOSIS — K602 Anal fissure, unspecified: Secondary | ICD-10-CM | POA: Diagnosis present

## 2020-11-15 DIAGNOSIS — K580 Irritable bowel syndrome with diarrhea: Secondary | ICD-10-CM | POA: Diagnosis present

## 2020-11-15 DIAGNOSIS — Z20822 Contact with and (suspected) exposure to covid-19: Secondary | ICD-10-CM | POA: Insufficient documentation

## 2020-11-15 DIAGNOSIS — K601 Chronic anal fissure: Secondary | ICD-10-CM | POA: Insufficient documentation

## 2020-11-15 DIAGNOSIS — K529 Noninfective gastroenteritis and colitis, unspecified: Secondary | ICD-10-CM | POA: Insufficient documentation

## 2020-11-15 DIAGNOSIS — K620 Anal polyp: Secondary | ICD-10-CM | POA: Insufficient documentation

## 2020-11-15 DIAGNOSIS — R040 Epistaxis: Secondary | ICD-10-CM

## 2020-11-15 DIAGNOSIS — K648 Other hemorrhoids: Secondary | ICD-10-CM | POA: Insufficient documentation

## 2020-11-15 HISTORY — DX: Other specified postprocedural states: Z98.890

## 2020-11-15 HISTORY — DX: Unspecified hemorrhoids: K64.9

## 2020-11-15 HISTORY — DX: Nausea with vomiting, unspecified: R11.2

## 2020-11-15 HISTORY — PX: SPHINCTEROTOMY: SHX5279

## 2020-11-15 HISTORY — DX: Family history of other specified conditions: Z84.89

## 2020-11-15 HISTORY — DX: Anal fissure, unspecified: K60.2

## 2020-11-15 HISTORY — PX: EVALUATION UNDER ANESTHESIA WITH HEMORRHOIDECTOMY: SHX5624

## 2020-11-15 LAB — POCT I-STAT, CHEM 8
BUN: 8 mg/dL (ref 6–20)
Calcium, Ion: 1.3 mmol/L (ref 1.15–1.40)
Chloride: 104 mmol/L (ref 98–111)
Creatinine, Ser: 0.8 mg/dL (ref 0.44–1.00)
Glucose, Bld: 80 mg/dL (ref 70–99)
HCT: 51 % — ABNORMAL HIGH (ref 36.0–46.0)
Hemoglobin: 17.3 g/dL — ABNORMAL HIGH (ref 12.0–15.0)
Potassium: 3.7 mmol/L (ref 3.5–5.1)
Sodium: 139 mmol/L (ref 135–145)
TCO2: 23 mmol/L (ref 22–32)

## 2020-11-15 SURGERY — SPHINCTEROTOMY, ANAL
Anesthesia: General

## 2020-11-15 MED ORDER — CHLORHEXIDINE GLUCONATE CLOTH 2 % EX PADS: 6.0000 | MEDICATED_PAD | Freq: Once | CUTANEOUS | Status: DC

## 2020-11-15 MED ORDER — GENTAMICIN SULFATE 40 MG/ML IJ SOLN
5.0000 mg/kg | INTRAVENOUS | Status: AC
  Administered 2020-11-15: 320 mg via INTRAVENOUS
  Filled 2020-11-15: qty 8

## 2020-11-15 MED ORDER — ROCURONIUM BROMIDE 10 MG/ML (PF) SYRINGE
PREFILLED_SYRINGE | INTRAVENOUS | Status: AC
  Filled 2020-11-15: qty 10

## 2020-11-15 MED ORDER — ENSURE PRE-SURGERY PO LIQD: 296.0000 mL | Freq: Once | ORAL | Status: DC

## 2020-11-15 MED ORDER — SCOPOLAMINE 1 MG/3DAYS TD PT72
MEDICATED_PATCH | TRANSDERMAL | Status: AC
  Filled 2020-11-15: qty 1

## 2020-11-15 MED ORDER — ONDANSETRON HCL 4 MG/2ML IJ SOLN
INTRAMUSCULAR | Status: AC
  Filled 2020-11-15: qty 2

## 2020-11-15 MED ORDER — KETAMINE HCL 10 MG/ML IJ SOLN
INTRAMUSCULAR | Status: AC
  Filled 2020-11-15: qty 1

## 2020-11-15 MED ORDER — PROPOFOL 10 MG/ML IV BOLUS
INTRAVENOUS | Status: DC | PRN
  Administered 2020-11-15: 140 mg via INTRAVENOUS

## 2020-11-15 MED ORDER — CELECOXIB 200 MG PO CAPS
ORAL_CAPSULE | ORAL | Status: AC
  Filled 2020-11-15: qty 1

## 2020-11-15 MED ORDER — OXYMETAZOLINE HCL 0.05 % NA SOLN
NASAL | Status: DC | PRN
  Administered 2020-11-15 (×3): 3 via NASAL

## 2020-11-15 MED ORDER — ACETAMINOPHEN 500 MG PO TABS: 1000.0000 mg | ORAL_TABLET | Freq: Once | ORAL | Status: DC | PRN

## 2020-11-15 MED ORDER — ROCURONIUM BROMIDE 10 MG/ML (PF) SYRINGE
PREFILLED_SYRINGE | INTRAVENOUS | Status: DC | PRN
  Administered 2020-11-15: 60 mg via INTRAVENOUS

## 2020-11-15 MED ORDER — AMISULPRIDE (ANTIEMETIC) 5 MG/2ML IV SOLN
5.0000 mg | Freq: Once | INTRAVENOUS | Status: AC
  Administered 2020-11-15: 5 mg via INTRAVENOUS

## 2020-11-15 MED ORDER — LACTATED RINGERS IV SOLN
INTRAVENOUS | Status: DC
  Administered 2020-11-15: 50 mL/h via INTRAVENOUS

## 2020-11-15 MED ORDER — ACETAMINOPHEN 10 MG/ML IV SOLN: 1000.0000 mg | Freq: Once | INTRAVENOUS | Status: DC | PRN

## 2020-11-15 MED ORDER — MIDAZOLAM HCL 2 MG/2ML IJ SOLN
INTRAMUSCULAR | Status: AC
  Filled 2020-11-15: qty 2

## 2020-11-15 MED ORDER — LIDOCAINE HCL (PF) 2 % IJ SOLN
INTRAMUSCULAR | Status: AC
  Filled 2020-11-15: qty 5

## 2020-11-15 MED ORDER — SCOPOLAMINE 1 MG/3DAYS TD PT72
MEDICATED_PATCH | TRANSDERMAL | Status: DC | PRN
  Administered 2020-11-15: 1 via TRANSDERMAL

## 2020-11-15 MED ORDER — EPHEDRINE SULFATE-NACL 50-0.9 MG/10ML-% IV SOSY
PREFILLED_SYRINGE | INTRAVENOUS | Status: DC | PRN
  Administered 2020-11-15 (×2): 10 mg via INTRAVENOUS

## 2020-11-15 MED ORDER — ACETAMINOPHEN 160 MG/5ML PO SOLN: 1000.0000 mg | Freq: Once | ORAL | Status: DC | PRN

## 2020-11-15 MED ORDER — OXYCODONE HCL 5 MG/5ML PO SOLN
5.0000 mg | Freq: Once | ORAL | Status: DC | PRN
Start: 2020-11-15 — End: 2020-11-15

## 2020-11-15 MED ORDER — CELECOXIB 200 MG PO CAPS
200.0000 mg | ORAL_CAPSULE | ORAL | Status: AC
  Administered 2020-11-15: 200 mg via ORAL

## 2020-11-15 MED ORDER — CLINDAMYCIN PHOSPHATE 900 MG/50ML IV SOLN
900.0000 mg | INTRAVENOUS | Status: AC
  Administered 2020-11-15: 900 mg via INTRAVENOUS

## 2020-11-15 MED ORDER — PROPOFOL 10 MG/ML IV BOLUS
INTRAVENOUS | Status: AC
  Filled 2020-11-15: qty 20

## 2020-11-15 MED ORDER — BUPIVACAINE-EPINEPHRINE 0.25% -1:200000 IJ SOLN
INTRAMUSCULAR | Status: DC | PRN
  Administered 2020-11-15: 20 mL

## 2020-11-15 MED ORDER — OXYCODONE HCL 5 MG PO TABS: 5.0000 mg | ORAL_TABLET | Freq: Four times a day (QID) | ORAL | 0 refills | Status: DC | PRN

## 2020-11-15 MED ORDER — LIDOCAINE 2% (20 MG/ML) 5 ML SYRINGE
INTRAMUSCULAR | Status: DC | PRN
  Administered 2020-11-15: 60 mg via INTRAVENOUS

## 2020-11-15 MED ORDER — OXYMETAZOLINE HCL 0.05 % NA SOLN
NASAL | Status: AC
  Filled 2020-11-15: qty 30

## 2020-11-15 MED ORDER — CLINDAMYCIN PHOSPHATE 900 MG/50ML IV SOLN
INTRAVENOUS | Status: AC
  Filled 2020-11-15: qty 50

## 2020-11-15 MED ORDER — AMISULPRIDE (ANTIEMETIC) 5 MG/2ML IV SOLN
INTRAVENOUS | Status: AC
  Filled 2020-11-15: qty 2

## 2020-11-15 MED ORDER — DEXAMETHASONE SODIUM PHOSPHATE 10 MG/ML IJ SOLN
INTRAMUSCULAR | Status: AC
  Filled 2020-11-15: qty 1

## 2020-11-15 MED ORDER — OXYCODONE HCL 5 MG PO TABS: 5.0000 mg | ORAL_TABLET | Freq: Once | ORAL | Status: DC | PRN

## 2020-11-15 MED ORDER — BUPIVACAINE LIPOSOME 1.3 % IJ SUSP: 20.0000 mL | Freq: Once | INTRAMUSCULAR | Status: DC

## 2020-11-15 MED ORDER — MIDAZOLAM HCL 5 MG/5ML IJ SOLN
INTRAMUSCULAR | Status: DC | PRN
  Administered 2020-11-15: 2 mg via INTRAVENOUS

## 2020-11-15 MED ORDER — LIDOCAINE 2% (20 MG/ML) 5 ML SYRINGE
INTRAMUSCULAR | Status: DC | PRN
  Administered 2020-11-15: 1.5 mg/kg/h via INTRAVENOUS

## 2020-11-15 MED ORDER — ACETAMINOPHEN 500 MG PO TABS
ORAL_TABLET | ORAL | Status: AC
  Filled 2020-11-15: qty 2

## 2020-11-15 MED ORDER — GABAPENTIN 300 MG PO CAPS
300.0000 mg | ORAL_CAPSULE | ORAL | Status: AC
  Administered 2020-11-15: 300 mg via ORAL

## 2020-11-15 MED ORDER — DIBUCAINE 1 % EX OINT
TOPICAL_OINTMENT | CUTANEOUS | Status: DC | PRN
  Administered 2020-11-15: 1 via TOPICAL

## 2020-11-15 MED ORDER — BUPIVACAINE LIPOSOME 1.3 % IJ SUSP
INTRAMUSCULAR | Status: DC | PRN
  Administered 2020-11-15: 20 mL

## 2020-11-15 MED ORDER — GABAPENTIN 300 MG PO CAPS
ORAL_CAPSULE | ORAL | Status: AC
  Filled 2020-11-15: qty 1

## 2020-11-15 MED ORDER — FENTANYL CITRATE (PF) 100 MCG/2ML IJ SOLN
25.0000 ug | INTRAMUSCULAR | Status: DC | PRN
Start: 2020-11-15 — End: 2020-11-15

## 2020-11-15 MED ORDER — FENTANYL CITRATE (PF) 100 MCG/2ML IJ SOLN
INTRAMUSCULAR | Status: DC | PRN
  Administered 2020-11-15: 50 ug via INTRAVENOUS

## 2020-11-15 MED ORDER — EPHEDRINE 5 MG/ML INJ
INTRAVENOUS | Status: AC
  Filled 2020-11-15: qty 10

## 2020-11-15 MED ORDER — ACETAMINOPHEN 500 MG PO TABS
1000.0000 mg | ORAL_TABLET | ORAL | Status: AC
  Administered 2020-11-15: 1000 mg via ORAL

## 2020-11-15 MED ORDER — FENTANYL CITRATE (PF) 100 MCG/2ML IJ SOLN
INTRAMUSCULAR | Status: AC
  Filled 2020-11-15: qty 2

## 2020-11-15 SURGICAL SUPPLY — 57 items
APL SKNCLS STERI-STRIP NONHPOA (GAUZE/BANDAGES/DRESSINGS) ×1
BENZOIN TINCTURE PRP APPL 2/3 (GAUZE/BANDAGES/DRESSINGS) ×3 IMPLANT
BLADE HEX COATED 2.75 (ELECTRODE) ×6 IMPLANT
BLADE SURG 10 STRL SS (BLADE) IMPLANT
BLADE SURG 15 STRL LF DISP TIS (BLADE) IMPLANT
BLADE SURG 15 STRL SS (BLADE)
BRIEF STRETCH FOR OB PAD LRG (UNDERPADS AND DIAPERS) ×3 IMPLANT
CANISTER SUCT 1200ML W/VALVE (MISCELLANEOUS) ×3 IMPLANT
COVER BACK TABLE 60X90IN (DRAPES) ×3 IMPLANT
COVER MAYO STAND STRL (DRAPES) ×3 IMPLANT
COVER WAND RF STERILE (DRAPES) ×3 IMPLANT
DECANTER SPIKE VIAL GLASS SM (MISCELLANEOUS) ×3 IMPLANT
DRAPE HYSTEROSCOPY (MISCELLANEOUS) ×3 IMPLANT
DRAPE LAPAROTOMY 100X72 PEDS (DRAPES) ×3 IMPLANT
DRAPE SHEET LG 3/4 BI-LAMINATE (DRAPES) ×3 IMPLANT
DRSG PAD ABDOMINAL 8X10 ST (GAUZE/BANDAGES/DRESSINGS) ×3 IMPLANT
ELECT NEEDLE TIP 2.8 STRL (NEEDLE) IMPLANT
ELECT REM PT RETURN 9FT ADLT (ELECTROSURGICAL) ×3
ELECTRODE REM PT RTRN 9FT ADLT (ELECTROSURGICAL) ×1 IMPLANT
FILTER STRAW (MISCELLANEOUS) ×3 IMPLANT
GAUZE SPONGE 4X4 12PLY STRL (GAUZE/BANDAGES/DRESSINGS) ×3 IMPLANT
GLOVE BIOGEL PI IND STRL 8 (GLOVE) ×1 IMPLANT
GLOVE BIOGEL PI INDICATOR 8 (GLOVE) ×2
GLOVE ECLIPSE 8.0 STRL XLNG CF (GLOVE) ×3 IMPLANT
GLOVE INDICATOR 8.0 STRL GRN (GLOVE) ×3 IMPLANT
GOWN STRL REUS W/TWL XL LVL3 (GOWN DISPOSABLE) ×3 IMPLANT
IV CATH PLACEMENT 20 GA (IV SOLUTION) ×3 IMPLANT
KIT SIGMOIDOSCOPE (SET/KITS/TRAYS/PACK) IMPLANT
KIT TURNOVER CYSTO (KITS) ×3 IMPLANT
LEGGING LITHOTOMY PAIR STRL (DRAPES) ×3 IMPLANT
NEEDLE HYPO 22GX1.5 SAFETY (NEEDLE) ×3 IMPLANT
NS IRRIG 500ML POUR BTL (IV SOLUTION) ×3 IMPLANT
PACK BASIN DAY SURGERY FS (CUSTOM PROCEDURE TRAY) ×3 IMPLANT
PAD PREP 24X48 CUFFED NSTRL (MISCELLANEOUS) IMPLANT
PENCIL SMOKE EVACUATOR (MISCELLANEOUS) ×3 IMPLANT
SCRUB TECHNI CARE 4 OZ NO DYE (MISCELLANEOUS) ×3 IMPLANT
SHEARS HARMONIC 9CM CVD (BLADE) IMPLANT
SURGILUBE 2OZ TUBE FLIPTOP (MISCELLANEOUS) ×3 IMPLANT
SUT CHROMIC 2 0 SH (SUTURE) IMPLANT
SUT CHROMIC 3 0 SH 27 (SUTURE) IMPLANT
SUT VIC AB 2-0 SH 27 (SUTURE)
SUT VIC AB 2-0 SH 27XBRD (SUTURE) IMPLANT
SUT VIC AB 2-0 UR6 27 (SUTURE) ×9 IMPLANT
SUT VICRYL 0 UR6 27IN ABS (SUTURE) IMPLANT
SUT VICRYL AB 2 0 TIE (SUTURE) IMPLANT
SUT VICRYL AB 2 0 TIES (SUTURE)
SYR 20ML LL LF (SYRINGE) ×3 IMPLANT
SYR 27GX1/2 1ML LL SAFETY (SYRINGE) ×3 IMPLANT
SYR BULB IRRIG 60ML STRL (SYRINGE) ×3 IMPLANT
SYR CONTROL 10ML LL (SYRINGE) IMPLANT
TAPE CLOTH 3X10 TAN LF (GAUZE/BANDAGES/DRESSINGS) ×3 IMPLANT
TOWEL OR 17X26 10 PK STRL BLUE (TOWEL DISPOSABLE) ×6 IMPLANT
TRAY DSU PREP LF (CUSTOM PROCEDURE TRAY) ×3 IMPLANT
TUBE CONNECTING 12'X1/4 (SUCTIONS) ×1
TUBE CONNECTING 12X1/4 (SUCTIONS) ×2 IMPLANT
UNDERPAD 30X36 HEAVY ABSORB (UNDERPADS AND DIAPERS) ×3 IMPLANT
YANKAUER SUCT BULB TIP NO VENT (SUCTIONS) ×3 IMPLANT

## 2020-11-15 NOTE — Anesthesia Preprocedure Evaluation (Addendum)
Anesthesia Evaluation  Patient identified by MRN, date of birth, ID band Patient awake    Reviewed: Allergy & Precautions, H&P , NPO status , Patient's Chart, lab work & pertinent test results  History of Anesthesia Complications (+) PONV and history of anesthetic complications  Airway Mallampati: II  TM Distance: >3 FB Neck ROM: Full    Dental  (+) Dental Advisory Given, Teeth Intact   Pulmonary neg pulmonary ROS, neg shortness of breath, neg COPD, neg recent URI,  Covid-19 Nucleic Acid Test Results Lab Results      Component                Value               Date                      [place]              NEGATIVE            11/12/2020              breath sounds clear to auscultation       Cardiovascular negative cardio ROS   Rhythm:Regular     Neuro/Psych  Headaches, negative psych ROS   GI/Hepatic negative GI ROS, Neg liver ROS,   Endo/Other  negative endocrine ROS  Renal/GU negative Renal ROS     Musculoskeletal negative musculoskeletal ROS (+)   Abdominal   Peds  Hematology negative hematology ROS (+)   Anesthesia Other Findings   Reproductive/Obstetrics negative OB ROS                            Anesthesia Physical Anesthesia Plan  ASA: II  Anesthesia Plan: General   Post-op Pain Management:    Induction: Intravenous  PONV Risk Score and Plan: 4 or greater and Ondansetron, Dexamethasone and Midazolam  Airway Management Planned: Oral ETT  Additional Equipment: None  Intra-op Plan:   Post-operative Plan: Extubation in OR  Informed Consent: I have reviewed the patients History and Physical, chart, labs and discussed the procedure including the risks, benefits and alternatives for the proposed anesthesia with the patient or authorized representative who has indicated his/her understanding and acceptance.     Dental advisory given  Plan Discussed with:  CRNA  Anesthesia Plan Comments:        Anesthesia Quick Evaluation

## 2020-11-15 NOTE — Transfer of Care (Signed)
Immediate Anesthesia Transfer of Care Note  Patient: Autumn Beasley  Procedure(s) Performed: SPHINCTEROTOMY (N/A ) EXAM UNDER ANESTHESIA WITH HEMORRHOIDPEXY, (N/A )  Patient Location: PACU  Anesthesia Type:General  Level of Consciousness: awake, alert  and oriented  Airway & Oxygen Therapy: Patient Spontanous Breathing and Patient connected to nasal cannula oxygen  Post-op Assessment: Report given to RN and Post -op Vital signs reviewed and stable  Post vital signs: Reviewed and stable  Last Vitals:  Vitals Value Taken Time  BP 123/68 11/15/20 1315  Temp 36.6 C 11/15/20 1309  Pulse 95 11/15/20 1317  Resp 15 11/15/20 1317  SpO2 100 % 11/15/20 1317  Vitals shown include unvalidated device data.  Last Pain:  Vitals:   11/15/20 1033  TempSrc: Oral  PainSc: 2       Patients Stated Pain Goal: 6 (30/09/23 3007)  Complications: No complications documented.

## 2020-11-15 NOTE — Op Note (Signed)
11/15/2020  1:30 PM  PATIENT:  Autumn Beasley  44 y.o. female  Patient Care Team: Glenda Chroman, MD as PCP - General (Internal Medicine) Michael Boston, MD as Consulting Physician (General Surgery) Pyrtle, Lajuan Lines, MD as Consulting Physician (Gastroenterology)  PRE-OPERATIVE DIAGNOSIS:  ANAL FISSURE REFRACTORY TO MEDICAL MANAGEMENT  POST-OPERATIVE DIAGNOSIS:    ANAL FISSURE REFRACTORY TO MEDICAL MANAGEMENT PROLAPSING ANAL CRYPT POLYPS X 2  PROCEDURE:    ANAL SPHINCTEROTOMY  EXCISION OF ANAL CRYPT POLYPS X 2 HEMORRHOIDAL LIGATION/PEXY ANORECTAL EXAM UNDER ANESTHESIA   SURGEON:  Adin Hector, MD  ASSISTANT: OR Staff   ANESTHESIA:   General Anorectal & Local field block (0.25% bupivacaine with epinephrine mixed with Liposomal bupivacaine (Experel)   EBL:  Total I/O In: 758 [I.V.:650; IV Piggyback:108] Out: 25 [Blood:25]  Delay start of Pharmacological VTE agent (>24hrs) due to surgical blood loss or risk of bleeding:  no  DRAINS: none   SPECIMEN:  Source of Specimen:  Anal crypt polyps x 2   & Anal fissure  DISPOSITION OF SPECIMEN:  PATHOLOGY  COUNTS:  YES  PLAN OF CARE: Discharge to home after PACU  PATIENT DISPOSITION:  PACU - hemodynamically stable.  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  I recommended examination and surgical treatment:  The anatomy & physiology of the anorectal region was discussed.  The pathophysiology of anal fissure and differential diagnosis was discussed.  Natural history progression  was discussed.   I stressed the importance of a bowel regimen to have daily soft bowel movements to minimize progression of disease.     The patient's condition is not adequately controlled.  Non-operative treatment has not healed the fissure.  Therefore, I recommended examination under anesthesia for better examination to confirm the diagnosis and treat by lateral internal sphincterotomy  to relax the spasm better & allow the fissure to heal.  Technique, benefits, alternatives were discussed.   I noted a good likelihood this will help address the problem.  Risks such as bleeding, pain, incontinence, recurrence, heart attack, death, and other risks were discussed.    Educational handouts further explaining the pathology, treatment options, and bowel regimen were given as well.  The patient expressed understanding & wishes to proceed with surgery.  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    Sphincterotomy location:  Left lateral anal canal.  66% distal internal sphincterotomy performed  IDESCRIPTION:   Informed consent was confirmed. Patient underwent general anesthesia without difficulty. Patient was placed into  prone positioning.  Patient bleeding was noted from nerve with her temporary pain.  Patient was placed back supine.  Pressure held & it resolved quickly.  Patient has not been on blood thinners where she hypertensive.  Exercise he should continue the perianal region was prepped and draped in sterile fashion. Surgical timeout confirmed or plan.  I did digital rectal examination and then transitioned over to anoscopy to get a sense of the anatomy.  I identified an anal fissure in the posterior midline anal canal.  The sphincter tone was increased.  There was some anal crypt polyps in the posterior circumference that usually prolapsed out.  No abscess located.  No fistula   I went ahead and proceeded with internal sphinterotmy technique.  Please take 2-0 Vicryl suture 6 cm proximal to a large on the left lateral rectum advancement down.  I excised through the anoderm of the left lateral anal  canal longitudinally.  I identified the internal and external sphincters.  Elevating the internal sphincter, I proceeded with a partial internal sphincterotomy starting distally and moving proximally using cautery.  This involved the  distal 66% thickness.  This provided improved relaxation of the anal sphincter.  Close continue to use Vicryl suture and closing down.  I excised the anal crypt polyps that were at the apex of the anal fissure.  I decided to hemorrhoidal ligation and pexy in the left lateral right posterior hemorrhoidal piles using 2-0 Vicryl in a running longitudinal fashion to good result.  I excised some scarring stricturing an anal fissure to have a more healthy granulation tissue and release a slight posterior midline scar.  I reexamined the anal canal.   There is was no narrowing.  Hemostasis was excellent.  I repeated anoscopy and examination.  Hemostasis was good.  Patient had no nasal bleeding prior to last 30 minutes.  Anesthesia aware and felt safe for extubation & discharge.  Patient is being extubated go to recovery room.  I discussed operative findings, updated the patient's status, discussed probable steps to recovery, and gave postoperative recommendations to the patient's spouse.  Updated patient husband about the spontaneous nosebleed.  He notes this happens quite often.  Usually self-limiting resolved quickly.  Recommend consider follow-up with primary care & maybe ENT for further work-up.  Recommendations were made.  Questions were answered.  He expressed understanding & appreciation.  Adin Hector, M.D., F.A.C.S. Gastrointestinal and Minimally Invasive Surgery [facility] [address], [place], [place] [id number] [phone] Main / Paging

## 2020-11-15 NOTE — Anesthesia Procedure Notes (Signed)
Procedure Name: Intubation Date/Time: 11/15/2020 11:51 AM Performed by: Irean Kendricks D, CRNA Pre-anesthesia Checklist: Patient identified, Emergency Drugs available, Suction available and Patient being monitored Patient Re-evaluated:Patient Re-evaluated prior to induction Oxygen Delivery Method: Circle system utilized Preoxygenation: Pre-oxygenation with 100% oxygen Induction Type: IV induction Ventilation: Mask ventilation without difficulty Laryngoscope Size: Mac and 3 Grade View: Grade I Tube type: Oral Number of attempts: 1 Airway Equipment and Method: Stylet Placement Confirmation: ETT inserted through vocal cords under direct vision,  positive ETCO2 and breath sounds checked- equal and bilateral Secured at: 21 cm Tube secured with: Tape Dental Injury: Teeth and Oropharynx as per pre-operative assessment

## 2020-11-15 NOTE — Discharge Instructions (Signed)
ANORECTAL SURGERY:  POST OPERATIVE INSTRUCTIONS  ######################################################################  EAT Start with a pureed / full liquid diet After 24 hours, gradually transition to a high fiber diet.    CONTROL PAIN Control pain so you can tolerate bowel movements,  walk, sleep, tolerate sneezing/coughing, and go up/down stairs.   HAVE A BOWEL MOVEMENT DAILY Keep your bowels regular to avoid problems.   Taking a fiber supplement every day to keep bowels soft.   Try a laxative to override constipation. Use an antidairrheal to slow down diarrhea.   Call if not better after 2 tries  WALK Walk an hour a day.  Control your pain to do that.   CALL IF YOU HAVE PROBLEMS/CONCERNS Call if you are still struggling despite following these instructions. Call if you have concerns not answered by these instructions  ######################################################################    1. Take your usually prescribed home medications unless otherwise directed.  2. DIET: Follow a light bland diet & liquids the first 24 hours after arrival home, such as soup, liquids, starches, etc.  Be sure to drink plenty of fluids.  Quickly advance to a usual solid diet within a few days.  Avoid fast food or heavy meals as your are more likely to get nauseated or have irregular bowels.  A low-fat, high-fiber diet for the rest of your life is ideal.  3. PAIN CONTROL: a. Pain is best controlled by a usual combination of three different methods TOGETHER: i. Ice/Heat ii. Over the counter pain medication iii. Prescription pain medication b. Expect swelling and discomfort in the anus/rectal area.  Warm water baths (30-60 minutes up to 6 times a day, especially after bowel meovements) will help. Use ice for the first few days to help decrease swelling and bruising, then switch to heat such as warm towels, sitz baths, warm baths, etc to help relax tight/sore spots and speed recovery.   Some people prefer to use ice alone, heat alone, alternating between ice & heat.  Experiment to what works for you.   c. It is helpful to take an over-the-counter pain medication continuously for the first few weeks.  Choose one of the following that works best for you: i. Naproxen (Aleve, etc)  Two 250m tabs twice a day ii. Ibuprofen (Advil, etc) Three 2072mtabs four times a day (every meal & bedtime) iii. Acetaminophen (Tylenol, etc) 500-65044mour times a day (every meal & bedtime) d. A  prescription for pain medication (such as oxycodone, hydrocodone, etc) should be given to you upon discharge.  Take your pain medication as prescribed.  i. If you are having problems/concerns with the prescription medicine (does not control pain, nausea, vomiting, rash, itching, etc), please call us [phone] see if we need to switch you to a different pain medicine that will work better for you and/or control your side effect better. ii. If you need a refill on your pain medication, please contact your pharmacy.  They will contact our office to request authorization. Prescriptions will not be filled after 5 pm or on week-ends.  If can take up to 48 hours for it to be filled & ready so avoid waiting until you are down to thel ast pill. e. A topical cream (Dibucaine) or a prescription for a cream (such as diltiazem 2% gel) may be given to you.  Many people find relief with topical creams.  Some people find it burns too much.  Experiment.  If it helps, use it.  If it burns, don't using  it.  Use a Sitz Bath 4-8 times a day for relief   [employer] A sitz bath is a warm water bath taken in the sitting position that covers only the hips and buttocks. It may be used for either healing or hygiene purposes. Sitz baths are also used to relieve pain, itching, or muscle spasms. The water may contain medicine. Moist heat will help you heal and relax.  HOME CARE INSTRUCTIONS  Take 3 to 4 sitz baths a day. 1. Fill the  bathtub half full with warm water. 2. Sit in the water and open the drain a little. 3. Turn on the warm water to keep the tub half full. Keep the water running constantly. 4. Soak in the water for 15 to 20 minutes. 5. After the sitz bath, pat the affected area dry first.   4. KEEP YOUR BOWELS REGULAR a. The goal is one soft bowel movement a day b. Avoid getting constipated.  Between the surgery and the pain medications, it is common to experience some constipation.  Increasing fluid intake and taking a fiber supplement (such as Metamucil, Citrucel, FiberCon, MiraLax, etc) 2-3 times a day regularly will usually help prevent this problem from occurring.  A mild laxative (prune juice, Milk of Magnesia, MiraLax, etc) should be taken according to package directions if there are no bowel movements after 48 hours. c. Watch out for diarrhea.  If you have many loose bowel movements, simplify your diet to bland foods & liquids for a few days.  Stop any stool softeners and decrease your fiber supplement.  Switching to mild anti-diarrheal medications (Kayopectate, Pepto Bismol) can help.  Can try an imodium/loperamide dose.  If this worsens or does not improve, please call us.  5. Wound Care  a. Remove your bandages with your first bowel movement, usually the day after surgery.  Let the gauze fall off with the first bowel movement or shower.   b. Wear an absorbent pad or soft cotton balls in your underwear as needed to catch any drainage and help keep the area  c. Keep the area clean and dry.  Bathe / shower every day.  Keep the area clean by showering / bathing over the incision / wound.   It is okay to soak an open wound to help wash it.  Consider using a squeeze bottle filled with warm water to gently wash the anal area.  Wet wipes or showers / gentle washing after bowel movements is often less traumatic than regular toilet paper. d. Dennis Bast will often notice bleeding with bowel movements.  This should slow down  by the end of the first week of surgery.  Sitting on an ice pack can help. e. Expect some drainage.  This should slow down by the end of the first week of surgery, but you will have occasional bleeding or drainage up to a few months after surgery.  Wear an absorbent pad or soft cotton gauze in your underwear until the drainage stops.  6. ACTIVITIES as tolerated:   a. You may resume regular (light) daily activities beginning the next day--such as daily self-care, walking, climbing stairs--gradually increasing activities as tolerated.  If you can walk 30 minutes without difficulty, it is safe to try more intense activity such as jogging, treadmill, bicycling, low-impact aerobics, swimming, etc. b. Save the most intensive and strenuous activity for last such as sit-ups, heavy lifting, contact sports, etc  Refrain from any heavy lifting or straining until you are off narcotics for  pain control.   c. DO NOT PUSH THROUGH PAIN.  Let pain be your guide: If it hurts to do something, don't do it.  Pain is your body warning you to avoid that activity for another week until the pain goes down. d. You may drive when you are no longer taking prescription pain medication, you can comfortably sit for long periods of time, and you can safely maneuver your car and apply brakes. e. Dennis Bast may have sexual intercourse when it is comfortable.  7. FOLLOW UP in our office a. Please call [facility] at [phone] to set up an appointment to see your surgeon in the office for a follow-up appointment approximately 2-3 weeks after your surgery. b. Make sure that you call for this appointment the day you arrive home to ensure a convenient appointment time.  8. IF YOU HAVE DISABILITY OR FAMILY LEAVE FORMS, BRING THEM TO THE OFFICE FOR PROCESSING.  DO NOT GIVE THEM TO YOUR DOCTOR.        WHEN TO CALL us [phone]: 1. Poor pain control 2. Reactions / problems with new medications (rash/itching, nausea, etc)  3. Fever over  101.5 F (38.5 C) 4. Inability to urinate 5. Nausea and/or vomiting 6. Worsening swelling or bruising 7. Continued bleeding from incision. 8. Increased pain, redness, or drainage from the incision  The clinic staff is available to answer your questions during regular business hours (8:30am-5pm).  Please don't hesitate to call and ask to speak to one of our nurses for clinical concerns.   A surgeon from [facility] is always on call at the hospitals   If you have a medical emergency, go to the nearest emergency room or call 911.    [facility], [address], [place], [place], [place]  [address] ? MAIN: [phone] ? TOLL FREE: [phone] ? FAX [phone] [email or web address]    Anal Fissure, Adult  An anal fissure is a small tear or crack in the tissue of the anus. Bleeding from a fissure usually stops on its own within a few minutes. However, bleeding will often occur again with each bowel movement until the fissure heals. What are the causes? This condition is usually caused by passing a large or hard stool (feces). Other causes include:  Constipation.  Frequent diarrhea.  Inflammatory bowel disease (Crohn's disease or ulcerative colitis).  Childbirth.  Infections.  Anal sex. What are the signs or symptoms? Symptoms of this condition include:  Bleeding from the rectum.  Small amounts of blood seen on your stool, on the toilet paper, or in the toilet after a bowel movement. The blood coats the outside of the stool and is not mixed with the stool.  Painful bowel movements.  Itching or irritation around the anus. How is this diagnosed? A health care provider may diagnose this condition by closely examining the anal area. An anal fissure can usually be seen with careful inspection. In some cases, a rectal exam may be performed, or a short tube (anoscope) may be used to examine the anal canal. How is this  treated? Initial treatment for this condition may include:  Taking steps to avoid constipation. This may include making changes to your diet, such as increasing your intake of fiber or fluid.  Taking fiber supplements. These supplements can soften your stool to help make bowel movements easier. Your health care provider may also prescribe a stool softener if your stool is hard.  Taking sitz baths. This may  help to heal the tear.  Using medicated creams or ointments. These may be prescribed to lessen discomfort. Treatments that are sometimes used if initial treatments do not work well or if the condition is more severe may include:  Botulinum injection.  Surgery to repair the fissure. Follow these instructions at home: Eating and drinking   Avoid foods that may cause constipation, such as bananas, milk, and other dairy products.  Eat all fruits, except bananas.  Drink enough fluid to keep your urine pale yellow.  Eat foods that are high in fiber, such as beans, whole grains, and fresh fruits and vegetables. General instructions   Take over-the-counter and prescription medicines only as told by your health care provider.  Use creams or ointments only as told by your health care provider.  Keep the anal area clean and dry.  Take sitz baths as told by your health care provider. Do not use soap in the sitz baths.  Keep all follow-up visits as told by your health care provider. This is important. Contact a health care provider if you have:  More bleeding.  A fever.  Diarrhea that is mixed with blood.  Pain that continues.  Ongoing problems that are getting worse rather than better. Summary  An anal fissure is a small tear or crack in the tissue of the anus. This condition is usually caused by passing a large or hard stool (feces). Other causes include constipation and frequent diarrhea.  Initial treatment for this condition may include taking steps to avoid  constipation, such as increasing your intake of fiber or fluid.  Follow instructions for care as told by your health care provider.  Contact your health care provider if you have more bleeding or your problem is getting worse rather than better.  Keep all follow-up visits as told by your health care provider. This is important. This information is not intended to replace advice given to you by your health care provider. Make sure you discuss any questions you have with your health care provider. Document Revised: 05/05/2018 Document Reviewed: 05/05/2018 Elsevier Patient Education  2020 [place] for Discharge Teaching: EXPAREL (bupivacaine liposome injectable suspension)   Your surgeon or anesthesiologist gave you EXPAREL(bupivacaine) to help control your pain after surgery.   EXPAREL is a local anesthetic that provides pain relief by numbing the tissue around the surgical site.  EXPAREL is designed to release pain medication over time and can control pain for up to 72 hours.  Depending on how you respond to EXPAREL, you may require less pain medication during your recovery.  Possible side effects:  Temporary loss of sensation or ability to move in the area where bupivacaine was injected.  Nausea, vomiting, constipation  Rarely, numbness and tingling in your mouth or lips, lightheadedness, or anxiety may occur.  Call your doctor right away if you think you may be experiencing any of these sensations, or if you have other questions regarding possible side effects.  Follow all other discharge instructions given to you by your surgeon or nurse. Eat a healthy diet and drink plenty of water or other fluids.  If you return to the hospital for any reason within 96 hours following the administration of EXPAREL, it is important for health care providers to know that you have received this anesthetic. A teal colored band has been placed on your arm with the date, time and  amount of EXPAREL you have received in order to alert and inform your health care providers.  Please leave this armband in place for the full 96 hours following administration, and then you may remove the band. Post Anesthesia Home Care Instructions  Activity: Get plenty of rest for the remainder of the day. A responsible individual must stay with you for 24 hours following the procedure.  For the next 24 hours, DO NOT: -Drive a car -Paediatric nurse -Drink alcoholic beverages -Take any medication unless instructed by your physician -Make any legal decisions or sign important papers.  Meals: Start with liquid foods such as gelatin or soup. Progress to regular foods as tolerated. Avoid greasy, spicy, heavy foods. If nausea and/or vomiting occur, drink only clear liquids until the nausea and/or vomiting subsides. Call your physician if vomiting continues.  Special Instructions/Symptoms: Your throat may feel dry or sore from the anesthesia or the breathing tube placed in your throat during surgery. If this causes discomfort, gargle with warm salt water. The discomfort should disappear within 24 hours.  If you had a scopolamine patch placed behind your ear for the management of post- operative nausea and/or vomiting:  1. The medication in the patch is effective for 72 hours, after which it should be removed.  Wrap patch in a tissue and discard in the trash. Wash hands thoroughly with soap and water. 2. You may remove the patch earlier than 72 hours if you experience unpleasant side effects which may include dry mouth, dizziness or visual disturbances. 3. Avoid touching the patch. Wash your hands with soap and water after contact with the patch.      Nosebleed, Adult A nosebleed is when blood comes out of the nose. Nosebleeds are common. Usually, they are not a sign of a serious condition. Nosebleeds can happen if a small blood vessel in your nose starts to bleed or if the lining of your nose  (mucous membrane) cracks. They are commonly caused by:  Allergies.  Colds.  Picking your nose.  Blowing your nose too hard.  An injury from sticking an object into your nose or getting hit in the nose.  Dry or cold air. Less common causes of nosebleeds include:  Toxic fumes.  Something abnormal in the nose or in the air-filled spaces in the bones of the face (sinuses).  Growths in the nose, such as polyps.  Medicines or conditions that cause blood to clot slowly.  Certain illnesses or procedures that irritate or dry out the nasal passages. Follow these instructions at home: When you have a nosebleed:   Sit down and tilt your head slightly forward.  Use a clean towel or tissue to pinch your nostrils under the bony part of your nose. After 10 minutes, let go of your nose and see if bleeding starts again. Do not release pressure before that time. If there is still bleeding, repeat the pinching and holding for 10 minutes until the bleeding stops.  Do not place tissues or gauze in the nose to stop bleeding.  Avoid lying down and avoid tilting your head backward. That may make blood collect in the throat and cause gagging or coughing.  Use a nasal spray decongestant to help with a nosebleed as told by your health care provider.  Do not use petroleum jelly or mineral oil in your nose. It can drip into your lungs. After a nosebleed:  Avoid blowing your nose or sniffing for a number of hours.  Avoid straining, lifting, or bending at the waist for several days. You may resume other normal activities as you are able.  Use saline spray or a humidifier as told by your health care provider.  Aspirinand blood thinners make bleeding more likely. If you are prescribed these medicines and you suffer from nosebleeds: ? Ask your health care provider if you should stop taking the medicines or if you should adjust the dose. ? Do not stop taking medicines that your health care provider has  recommended unless told by your health care provider.  If your nosebleed was caused by dry mucous membranes, use over-the-counter saline nasal spray or gel. This will keep the mucous membranes moist and allow them to heal. If you must use a lubricant: ? Choose one that is water-soluble. ? Use only as much as you need and use it only as often as needed. ? Do not lie down until several hours after you use it. Contact a health care provider if:  You have a fever.  You get nosebleeds often or more often than usual.  You bruise very easily.  You have a nosebleed from having something stuck in your nose.  You have bleeding in your mouth.  You vomit or cough up brown material.  You have a nosebleed after you start a new medicine. Get help right away if:  You have a nosebleed after a fall or a head injury.  Your nosebleed does not go away after 20 minutes.  You feel dizzy or weak.  You have unusual bleeding from other parts of your body.  You have unusual bruising on other parts of your body.  You become sweaty.  You vomit blood. This information is not intended to replace advice given to you by your health care provider. Make sure you discuss any questions you have with your health care provider. Document Revised: 02/22/2018 Document Reviewed: 06/09/2016 Elsevier Patient Education  [place].

## 2020-11-15 NOTE — H&P (Signed)
[place] [place] Appointment: 07/09/2020 10:45 AM Location: [place] Office Patient #: [phone] DOB: Sep 27, 1976 Married / Language: Cleophus Molt / Race: White Female   History of Present Illness Adin Hector MD; 07/09/2020 1:44 PM) The patient is a 44 year old female who presents with anal pain. Note for "Anal pain": ` ` ` Patient sent for surgical consultation at the request of Dr Hilarie Fredrickson  Chief Complaint: Anal pain, possible fissure ` ` The patient is a woman followed by [facility] gastroenterology. History of diarrhea presumed to be due to IBS. Prior colonoscopy with one sessile serrated polyp. She noticed anal pain with defecation starting about 2 months ago around the time she started having worsening diarrhea. Having up to 10 bowel movements a day. Frustrating and painful. She reached out to-neurology. They suspected a possible fissure. Recommended diltiazem cream. she is been on that for at least 2 weeks. Using ResctiCare NTG ointment after each bowel movement. Doing Diltiazem 3 times a day. Not helping much until past week. Patient struggling with diarrhea. Started on Lomotil last week. Her bowels are slowed down to about 6-8 times a day. She's noticed less of bleeding. However she has sharp pain with bowel movements. No purulence. based on concerns of persistent symptoms despite topical therapy, surgical consultation offered. Therefore fit in next week.  Patient notes that she is taking Lomotil about twice a day and going about 6 times a day. Bleeding has gone down but she still has sharp pain. She did have stool studies which are negative for C. difficile or other viral or bacterial etiologies. She is walking okay with decent urine output and feels tired and worn out. She is afraid to eat less she get diarrhea again. Denies any smoking or diabetes. 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 she gets some crampy abdominal pain. Her sister has been  diagnosed with Crohn's but she did not have any concerns on colonoscopy 4 years ago. She is due for a follow-up colonoscopy that has not happened yet. She notes her diarrhea resolved with Levsin and Xifaxan 2 week course last time she had diarrhea 3 years ago.  (Review of systems as stated in this history (HPI) or in the review of systems. Otherwise all other 12 point ROS are negative) ` ` `  This patient encounter took 45 minutes today to perform the following: obtain history, perform exam, review outside records, interpret tests & imaging, counsel the patient on their diagnosis; and, document this encounter, including findings & plan in the electronic health record (EHR).   Past Surgical History Illene Regulus, CMA; 07/09/2020 11:11 AM) Gallbladder Surgery - Laparoscopic  Hysterectomy (not due to cancer) - Partial  Oral Surgery  Tonsillectomy   Diagnostic Studies History Illene Regulus, CMA; 07/09/2020 11:11 AM) Colonoscopy  1-5 years ago Mammogram  within last year Pap Smear  1-5 years ago  Allergies Illene Regulus, CMA; 07/09/2020 11:12 AM) Morphine Derivatives  Penicillins   Medication History (Alisha Spillers, CMA; 07/09/2020 11:13 AM) Diphenoxylate-Atropine (2.5-0.025MG  Tablet, Oral) Active. DULoxetine HCl (30MG  Capsule DR Part, Oral) Active. Gabapentin (300MG  Capsule, Oral) Active. Hydroxychloroquine Sulfate (200MG  Tablet, Oral) Active. Hyoscyamine Sulfate (0.125MG  Tab Sublingual, Sublingual) Active. Omeprazole (40MG  Capsule DR, Oral) Active. Promethazine HCl (25MG  Tablet, Oral) Active. tiZANidine HCl (4MG  Tablet, Oral) Active. Topiramate (100MG  Tablet, Oral) Active. DILTIAZEM Gel (2% Gel, External) Active. Medications Reconciled  Social History Illene Regulus, CMA; 07/09/2020 11:11 AM) Caffeine use  Carbonated beverages. No alcohol use  No drug use  Tobacco use  Never smoker.  Family History Illene Regulus, CMA; 07/09/2020 11:11 AM) Colon  Polyps  Sister. Diabetes Mellitus  Mother. Ischemic Bowel Disease  Sister. Melanoma  Father. Migraine Headache  Daughter.  Pregnancy / Birth History Illene Regulus, CMA; 07/09/2020 11:11 AM) Age at menarche  62 years. Gravida  4 Length (months) of breastfeeding  3-6 Maternal age  70-25 Para  2  Other Problems Illene Regulus, CMA; 07/09/2020 11:11 AM) Back Pain  Gastroesophageal Reflux Disease  Migraine Headache     Review of Systems (Alisha Spillers CMA; 07/09/2020 11:11 AM) General Present- Appetite Loss and Weight Loss. Not Present- Chills, Fatigue, Fever, Night Sweats and Weight Gain. Skin Not Present- Change in Wart/Mole, Dryness, Hives, Jaundice, New Lesions, Non-Healing Wounds, Rash and Ulcer. HEENT Not Present- Earache, Hearing Loss, Hoarseness, Nose Bleed, Oral Ulcers, Ringing in the Ears, Seasonal Allergies, Sinus Pain, Sore Throat, Visual Disturbances, Wears glasses/contact lenses and Yellow Eyes. Respiratory Not Present- Bloody sputum, Chronic Cough, Difficulty Breathing, Snoring and Wheezing. Breast Not Present- Breast Mass, Breast Pain, Nipple Discharge and Skin Changes. Cardiovascular Not Present- Chest Pain, Difficulty Breathing Lying Down, Leg Cramps, Palpitations, Rapid Heart Rate, Shortness of Breath and Swelling of Extremities. Gastrointestinal Present- Abdominal Pain, Chronic diarrhea, Gets full quickly at meals, Nausea and Rectal Pain. Not Present- Bloating, Bloody Stool, Change in Bowel Habits, Constipation, Difficulty Swallowing, Excessive gas, Hemorrhoids, Indigestion and Vomiting. Female Genitourinary Not Present- Frequency, Nocturia, Painful Urination, Pelvic Pain and Urgency. Musculoskeletal Present- Back Pain and Muscle Weakness. Not Present- Joint Pain, Joint Stiffness, Muscle Pain and Swelling of Extremities. Psychiatric Not Present- Anxiety, Bipolar, Change in Sleep Pattern, Depression, Fearful and Frequent crying. Endocrine Not Present- Cold  Intolerance, Excessive Hunger, Hair Changes, Heat Intolerance, Hot flashes and New Diabetes. Hematology Not Present- Blood Thinners, Easy Bruising, Excessive bleeding, Gland problems, HIV and Persistent Infections.  Vitals (Alisha Spillers CMA; 07/09/2020 11:11 AM) 07/09/2020 11:11 AM Weight: 177 lb Height: 63in Body Surface Area: 1.84 m Body Mass Index: 31.35 kg/m  Pulse: 88 (Regular)  BP: 102/70(Sitting, Left Arm, Standard)       Physical Exam Adin Hector MD; 07/09/2020 11:34 AM) General Mental Status-Alert. General Appearance-Not in acute distress, Not Sickly. Orientation-Oriented X3. Hydration-Well hydrated. Voice-Normal. Note: sitting normally. No guarding/splinting. Not toxic nor sickly.   Integumentary Global Assessment Upon inspection and palpation of skin surfaces of the - Axillae: non-tender, no inflammation or ulceration, no drainage. and Distribution of scalp and body hair is normal. General Characteristics Temperature - normal warmth is noted.  Head and Neck Head-normocephalic, atraumatic with no lesions or palpable masses. Face Global Assessment - atraumatic, no absence of expression. Neck Global Assessment - no abnormal movements, no bruit auscultated on the right, no bruit auscultated on the left, no decreased range of motion, non-tender. Trachea-midline. Thyroid Gland Characteristics - non-tender.  Eye Eyeball - Left-Extraocular movements intact, No Nystagmus - Left. Eyeball - Right-Extraocular movements intact, No Nystagmus - Right. Cornea - Left-No Hazy - Left. Cornea - Right-No Hazy - Right. Sclera/Conjunctiva - Left-No scleral icterus, No Discharge - Left. Sclera/Conjunctiva - Right-No scleral icterus, No Discharge - Right. Pupil - Left-Direct reaction to light normal. Pupil - Right-Direct reaction to light normal.  ENMT Ears Pinna - Left - no drainage observed, no generalized tenderness observed. Pinna  - Right - no drainage observed, no generalized tenderness observed. Nose and Sinuses External Inspection of the Nose - no destructive lesion observed. Inspection of the nares - Left - quiet respiration. Inspection of the nares - Right -  quiet respiration. Mouth and Throat Lips - Upper Lip - no fissures observed, no pallor noted. Lower Lip - no fissures observed, no pallor noted. Nasopharynx - no discharge present. Oral Cavity/Oropharynx - Tongue - no dryness observed. Oral Mucosa - no cyanosis observed. Hypopharynx - no evidence of airway distress observed.  Chest and Lung Exam Inspection Movements - Normal and Symmetrical. Accessory muscles - No use of accessory muscles in breathing. Palpation Palpation of the chest reveals - Non-tender. Auscultation Breath sounds - Normal and Clear.  Cardiovascular Auscultation Rhythm - Regular. Murmurs & Other Heart Sounds - Auscultation of the heart reveals - No Murmurs and No Systolic Clicks.  Abdomen Inspection Inspection of the abdomen reveals - No Visible peristalsis and No Abnormal pulsations. Umbilicus - No Bleeding, No Urine drainage. Palpation/Percussion Palpation and Percussion of the abdomen reveal - Soft, Non Tender, No Rebound tenderness, No Rigidity (guarding) and No Cutaneous hyperesthesia. Note: Abdomen soft. Not severely distended. No distasis recti. No umbilical or other anterior abdominal wall hernias   Female Genitourinary Sexual Maturity Tanner 5 - Adult hair pattern. Note: No vaginal bleeding nor discharge   Rectal Note: posterior midline anal fissure slightly left-sided. Some granulation. Mildly sensitive.  Increased sphincter tone. No obvious abscess or fistula. No external hemorrhoids. No pruritus. No pilonidal disease. Held off on internal exam   Peripheral Vascular Upper Extremity Inspection - Left - No Cyanotic nailbeds - Left, Not Ischemic. Inspection - Right - No Cyanotic nailbeds - Right, Not  Ischemic.  Neurologic Neurologic evaluation reveals -normal attention span and ability to concentrate, able to name objects and repeat phrases. Appropriate fund of knowledge , normal sensation and normal coordination. Mental Status Affect - not angry, not paranoid. Cranial Nerves-Normal Bilaterally. Gait-Normal.  Neuropsychiatric Mental status exam performed with findings of-able to articulate well with normal speech/language, rate, volume and coherence, thought content normal with ability to perform basic computations and apply abstract reasoning and no evidence of hallucinations, delusions, obsessions or homicidal/suicidal ideation.  Musculoskeletal Global Assessment Spine, Ribs and Pelvis - no instability, subluxation or laxity. Right Upper Extremity - no instability, subluxation or laxity.  Lymphatic Head & Neck  General Head & Neck Lymphatics: Bilateral - Description - No Localized lymphadenopathy. Axillary  General Axillary Region: Bilateral - Description - No Localized lymphadenopathy. Femoral & Inguinal  Generalized Femoral & Inguinal Lymphatics: Left - Description - No Localized lymphadenopathy. Right - Description - No Localized lymphadenopathy.    Assessment & Plan Adin Hector MD; 07/09/2020 11:52 AM) ANAL FISSURE (K60.2) Impression: history and physical classic for posterior midline anal fissure in the setting of diarrhea.  I strongly recommend she try get her diarrhea under control. It is better on Lomotil 2 pills a day. Increased to 3 a day and up to 4. If that does not help, reach out to gastroenterology to see if a trial of Xifaxan which had worked in the past would be of use.  If she is been on diltiazem 4 times a day for 6 weeks without improvement, examination under anesthesia with probable partial internal sphincterotomy. With her diarrhea IBS issues risk of occasional incontinence is increased but did not think it is a good idea to keep this failure  indefinitely. She would like to hold off on surgery bed wants something done. Because she is starting to feel better with her diarrhea under control, hopefully we can resolve this nonoperatively. We will see. Current Plans Call back in 1 week with progress Pt Education - [facility] Anal Fissure (  Summerlyn Fickel) The anatomy & physiology of the anorectal region was discussed. The pathophysiology of anal fissure and differential diagnosis was discussed. Natural history progression was discussed. I stressed the importance of a bowel regimen to have daily soft bowel movements to minimize progression of disease.  The patient's condition is not adequately controlled. Non-operative treatment has not healed the fissure. Therefore, I recommended examination under anesthesia for better examination to confirm the diagnosis and treat by lateral internal sphincterotomy to relax the spasm better & allow the fissure to heal. Technique, benefits, alternatives were discussed. I noted a good likelihood this will help address the problem. Risks such as bleeding, pain, incontinence, recurrence, heart attack, death, and other risks were discussed.  Educational handouts further explaining the pathology, treatment options, and bowel regimen were given as well. The patient expressed understanding & wishes to proceed with surgery.  Started DILTIAZEM GEL, 2% (External Gel), 1 (one) application four times daily, 15 Gram, 07/09/2020, Ref. x3. Local Order: Pharmacist Notes: Apply on anus for 3-6 weeks to allow fissure to heal Pt Education - [facility] Rectal Prep for Anorectal outpatient/office surgery: discussed with patient and provided information. Pt Education - [facility] Rectal Surgery HCI (Dalayah Deahl): discussed with patient and provided information. 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 Regenerative gastrology  to see if further insights are needed. Increased Lomotil to 3 pills a day and then up to 4 a day by the end of the week. No improvement, then check w GI to see if Xifaxan other medication trial of help versus moving colonoscopy up sooner. Not ideal to do colonoscopy & bowel prep in the middle of anal fissure, but may need that to sort things out. Defer to GI Current Plans Pt Education - [facility] IBS patient info: discussed with patient and provided information. Pt Education - [facility] Good Bowel Health (Charlita Brian)  ADDENDUM: Patient initially seemed to respond to topical diltiazem based therapy.  Then developed recurrent symptoms.  Wishes to treat fissure first before dealing with colonoscopy.  The anatomy & physiology of the anorectal region was discussed.  The pathophysiology of anal fissure and differential diagnosis was discussed.  Natural history progression  was discussed.   I stressed the importance of a bowel regimen to have daily soft bowel movements to minimize progression of disease.     The patient's condition is not adequately controlled.  Non-operative treatment has not healed the fissure.  Therefore, I recommended examination under anesthesia for better examination to confirm the diagnosis and treat by lateral internal sphincterotomy to relax the spasm better & allow the fissure to heal.  Technique, benefits, alternatives were discussed.    .  I noted a good likelihood this will help address the problem.  Risks such as bleeding, pain, incontinence, injury to other organs, need for repair of tissues / organs, recurrence, heart attack, death, and other risks were discussed.    Educational handouts further explaining the pathology, treatment options, and bowel regimen were given as well.  The patient expressed understanding & wishes to proceed with surgery.     Adin Hector, MD, FACS, MASCRS Gastrointestinal and Minimally Invasive Surgery  [facility] [address], [place],  [address] [phone] Fax [phone] Main/Paging  CONTACT INFORMATION: Weekday (9AM-5PM) concerns: Call [facility] main office at [phone] Weeknight (5PM-9AM) or Weekend/Holiday concerns: Check [email or web address] for General Surgery [facility] coverage (Please, do not  use SecureChat as it is not reliable communication to operating surgeons for immediate patient care)

## 2020-11-18 ENCOUNTER — Encounter (HOSPITAL_BASED_OUTPATIENT_CLINIC_OR_DEPARTMENT_OTHER): Payer: Self-pay | Admitting: Surgery

## 2020-11-18 LAB — SURGICAL PATHOLOGY

## 2020-11-21 NOTE — Anesthesia Postprocedure Evaluation (Signed)
Anesthesia Post Note  Patient: Autumn Beasley  Procedure(s) Performed: SPHINCTEROTOMY (N/A ) EXAM UNDER ANESTHESIA WITH HEMORRHOIDPEXY, (N/A )     Anesthesia Post Evaluation No complications documented.  Last Vitals:  Vitals:   11/15/20 1345 11/15/20 1509  BP: 118/73 115/75  Pulse: 79 70  Resp: 10 12  Temp:  36.8 C  SpO2: 100% 96%    Last Pain:  Vitals:   11/18/20 1018  TempSrc:   PainSc: 4                  Uriah Trueba

## 2020-12-23 ENCOUNTER — Ambulatory Visit: Payer: BC Managed Care – PPO | Admitting: Dermatology

## 2021-01-15 ENCOUNTER — Ambulatory Visit: Payer: BC Managed Care – PPO | Admitting: Neurology

## 2021-02-18 ENCOUNTER — Encounter: Payer: Self-pay | Admitting: Internal Medicine

## 2021-03-12 ENCOUNTER — Ambulatory Visit: Payer: BC Managed Care – PPO | Admitting: Physician Assistant

## 2021-03-12 ENCOUNTER — Encounter: Payer: Self-pay | Admitting: Physician Assistant

## 2021-03-12 ENCOUNTER — Other Ambulatory Visit: Payer: Self-pay

## 2021-03-12 DIAGNOSIS — L811 Chloasma: Secondary | ICD-10-CM | POA: Diagnosis not present

## 2021-03-12 MED ORDER — HYDROQUINONE 4 % EX CREA: TOPICAL_CREAM | Freq: Every evening | CUTANEOUS | 11 refills | Status: DC

## 2021-03-20 ENCOUNTER — Other Ambulatory Visit: Payer: Self-pay

## 2021-03-20 ENCOUNTER — Ambulatory Visit (AMBULATORY_SURGERY_CENTER): Payer: BC Managed Care – PPO

## 2021-03-20 VITALS — Ht 63.0 in | Wt 189.0 lb

## 2021-03-20 DIAGNOSIS — R1310 Dysphagia, unspecified: Secondary | ICD-10-CM

## 2021-03-20 DIAGNOSIS — Z86010 Personal history of colonic polyps: Secondary | ICD-10-CM

## 2021-03-20 MED ORDER — PEG-KCL-NACL-NASULF-NA ASC-C 100 G PO SOLR: 1.0000 | Freq: Once | ORAL | 0 refills | Status: AC

## 2021-03-20 NOTE — Progress Notes (Signed)
Pt verified name, DOB, address and insurance during PV today. Pt mailed instruction packet , copy of consent form to read and not return, and instructions. PV completed over the phone. Pt encouraged to call with questions or issues. No allergies to soy or egg  Pt is not on blood thinners or diet pills Denies issues with sedation/intubation Denies atrial flutter/fib Denies constipation     Pt is aware of Covid safety and care partner requirements.

## 2021-04-04 ENCOUNTER — Telehealth: Payer: Self-pay | Admitting: Internal Medicine

## 2021-04-04 NOTE — Telephone Encounter (Signed)
Hi Dr. Hilarie Fredrickson, this patient just called to cancel her procedures that were scheduled on 04/09/21 because she tested positive for Covid yesterday. Patient has rescheduled to 06/12/21. Thank you.

## 2021-04-07 ENCOUNTER — Encounter: Payer: Self-pay | Admitting: Physician Assistant

## 2021-04-07 NOTE — Progress Notes (Signed)
   Follow-Up Visit   Subjective  Autumn Beasley is a 45 y.o. female who presents for the following: Skin Problem (Melasma has been flaring on top lipx 3 years and 8 months- ordered Hydroquinone on [place] and it doesn't seem to be helping. No signs of outright menopause. She is at the age where her hormones are changing. She does not have a menstrual cycle as she had a hystorectomy.. Per review of records no new medications that may affect her condition. Not on birth control medication.    The following portions of the chart were reviewed this encounter and updated as appropriate:  Tobacco  Allergies  Meds  Problems  Med Hx  Surg Hx  Fam Hx      Objective  Well appearing patient in no apparent distress; mood and affect are within normal limits.  A full examination was performed including scalp, head, eyes, ears, nose, lips, neck, chest, axillae, abdomen, back, buttocks, bilateral upper extremities, bilateral lower extremities, hands, feet, fingers, toes, fingernails, and toenails. All findings within normal limits unless otherwise noted below.  Objective  Mid Upper Vermilion Lip: Reticulated hyperpigmented patches.    Assessment & Plan  Melasma Mid Upper Vermilion Lip  Minimum spf 40 daily. Recommend mechanical blocking sunscreen and a hat.  hydroquinone 4 % cream - Mid Upper Vermilion Lip    I, Tyra Gural, PA-C, have reviewed all documentation's for this visit.  The documentation on 04/07/21 for the exam, diagnosis, procedures and orders are all accurate and complete.

## 2021-04-09 ENCOUNTER — Encounter: Payer: BC Managed Care – PPO | Admitting: Internal Medicine

## 2021-06-12 ENCOUNTER — Encounter: Payer: Self-pay | Admitting: Internal Medicine

## 2021-06-12 ENCOUNTER — Ambulatory Visit (AMBULATORY_SURGERY_CENTER): Payer: BC Managed Care – PPO | Admitting: Internal Medicine

## 2021-06-12 ENCOUNTER — Other Ambulatory Visit: Payer: Self-pay

## 2021-06-12 VITALS — BP 111/70 | HR 76 | Temp 98.7°F | Resp 14 | Ht 63.0 in | Wt 189.0 lb

## 2021-06-12 DIAGNOSIS — K222 Esophageal obstruction: Secondary | ICD-10-CM

## 2021-06-12 DIAGNOSIS — K635 Polyp of colon: Secondary | ICD-10-CM

## 2021-06-12 DIAGNOSIS — M341 CR(E)ST syndrome: Secondary | ICD-10-CM

## 2021-06-12 DIAGNOSIS — Z86010 Personal history of colonic polyps: Secondary | ICD-10-CM

## 2021-06-12 DIAGNOSIS — K219 Gastro-esophageal reflux disease without esophagitis: Secondary | ICD-10-CM

## 2021-06-12 DIAGNOSIS — D123 Benign neoplasm of transverse colon: Secondary | ICD-10-CM

## 2021-06-12 DIAGNOSIS — R1310 Dysphagia, unspecified: Secondary | ICD-10-CM | POA: Diagnosis present

## 2021-06-12 MED ORDER — SODIUM CHLORIDE 0.9 % IV SOLN
500.0000 mL | Freq: Once | INTRAVENOUS | Status: DC
Start: 2021-06-12 — End: 2021-06-12

## 2021-06-12 NOTE — Progress Notes (Signed)
Report to PACU, RN, vss, BBS= Clear.  

## 2021-06-12 NOTE — Patient Instructions (Signed)
Handout on polyps given to you today  Await pathology results from Dr. Hilarie Fredrickson  YOU HAD AN ENDOSCOPIC PROCEDURE TODAY AT THE [facility]:   Refer to the procedure report that was given to you for any specific questions about what was found during the examination.  If the procedure report does not answer your questions, please call your gastroenterologist to clarify.  If you requested that your care partner not be given the details of your procedure findings, then the procedure report has been included in a sealed envelope for you to review at your convenience later.  YOU SHOULD EXPECT: Some feelings of bloating in the abdomen. Passage of more gas than usual.  Walking can help get rid of the air that was put into your GI tract during the procedure and reduce the bloating. If you had a lower endoscopy (such as a colonoscopy or flexible sigmoidoscopy) you may notice spotting of blood in your stool or on the toilet paper. If you underwent a bowel prep for your procedure, you may not have a normal bowel movement for a few days.  Please Note:  You might notice some irritation and congestion in your nose or some drainage.  This is from the oxygen used during your procedure.  There is no need for concern and it should clear up in a day or so.  SYMPTOMS TO REPORT IMMEDIATELY:  Following lower endoscopy (colonoscopy or flexible sigmoidoscopy):  Excessive amounts of blood in the stool  Significant tenderness or worsening of abdominal pains  Swelling of the abdomen that is new, acute  Fever of 100F or higher  Following upper endoscopy (EGD)  Vomiting of blood or coffee ground material  New chest pain or pain under the shoulder blades  Painful or persistently difficult swallowing  New shortness of breath  Fever of 100F or higher  Black, tarry-looking stools  For urgent or emergent issues, a gastroenterologist can be reached at any hour by calling [phone]. Do not use MyChart  messaging for urgent concerns.    DIET:  We do recommend a small meal at first, but then you may proceed to your regular diet.  Drink plenty of fluids but you should avoid alcoholic beverages for 24 hours.  ACTIVITY:  You should plan to take it easy for the rest of today and you should NOT DRIVE or use heavy machinery until tomorrow (because of the sedation medicines used during the test).    FOLLOW UP: Our staff will call the number listed on your records 48-72 hours following your procedure to check on you and address any questions or concerns that you may have regarding the information given to you following your procedure. If we do not reach you, we will leave a message.  We will attempt to reach you two times.  During this call, we will ask if you have developed any symptoms of COVID 19. If you develop any symptoms (ie: fever, flu-like symptoms, shortness of breath, cough etc.) before then, please call [phone].  If you test positive for Covid 19 in the 2 weeks post procedure, please call and report this information to [place].    If any biopsies were taken you will be contacted by phone or by letter within the next 1-3 weeks.  Please call us at [phone] if you have not heard about the biopsies in 3 weeks.    SIGNATURES/CONFIDENTIALITY: You and/or your care partner have signed paperwork which will be entered into your electronic medical record.  These signatures attest to the fact that that the information above on your After Visit Summary has been reviewed and is understood.  Full responsibility of the confidentiality of this discharge information lies with you and/or your care-partner.  

## 2021-06-12 NOTE — Op Note (Signed)
[place] Patient Name: Autumn Beasley Procedure Date: 06/12/2021 2:16 PM MRN: [id number] Endoscopist: Jerene Bears , MD Age: 45 Referring MD:  Date of Birth: December 20, 1975 Gender: Female Account #: [id number] Procedure:                Upper GI endoscopy Indications:              Dysphagia, Gastro-esophageal reflux disease, CREST Medicines:                Monitored Anesthesia Care Procedure:                Pre-Anesthesia Assessment:                           - Prior to the procedure, a History and Physical                            was performed, and patient medications and                            allergies were reviewed. The patient's tolerance of                            previous anesthesia was also reviewed. The risks                            and benefits of the procedure and the sedation                            options and risks were discussed with the patient.                            All questions were answered, and informed consent                            was obtained. Prior Anticoagulants: The patient has                            taken no previous anticoagulant or antiplatelet                            agents. ASA Grade Assessment: II - A patient with                            mild systemic disease. After reviewing the risks                            and benefits, the patient was deemed in                            satisfactory condition to undergo the procedure.                           After obtaining informed consent, the endoscope was  passed under direct vision. Throughout the                            procedure, the patient's blood pressure, pulse, and                            oxygen saturations were monitored continuously. The                            GIF [id number] was introduced through the                            mouth, and advanced to the second part of duodenum.                            The upper  GI endoscopy was accomplished without                            difficulty. The patient tolerated the procedure                            well. Scope In: Scope Out: Findings:                 A non-obstructing Schatzki ring was found at the                            gastroesophageal junction. The scope was withdrawn.                            The esophageal mucosa was normal. Z-line regular at                            36 cm. Dilation was performed with a Maloney                            dilator with mild resistance at 52 Fr.                           The entire examined stomach was normal.                           The examined duodenum was normal. Complications:            No immediate complications. Estimated Blood Loss:     Estimated blood loss: none. Impression:               - Non-obstructing Schatzki ring. Esophageal                            dilation performed with 52 Fr Maloney.                           - Normal stomach.                           -  Normal examined duodenum.                           - No specimens collected. Recommendation:           - Patient has a contact number available for                            emergencies. The signs and symptoms of potential                            delayed complications were discussed with the                            patient. Return to normal activities tomorrow.                            Written discharge instructions were provided to the                            patient.                           - Resume previous diet.                           - Continue present medications.                           - If dysphagia symptoms persist consider esophageal                            manometry in the setting of CREST syndrome. Jerene Bears, MD 06/12/2021 3:00:25 PM This report has been signed electronically.

## 2021-06-12 NOTE — Progress Notes (Signed)
Called to room to assist during endoscopic procedure.  Patient ID and intended procedure confirmed with present staff. Received instructions for my participation in the procedure from the performing physician.  

## 2021-06-12 NOTE — Op Note (Signed)
[place] Patient Name: Autumn Beasley Procedure Date: 06/12/2021 2:10 PM MRN: [id number] Endoscopist: Jerene Bears , MD Age: 45 Referring MD:  Date of Birth: July 15, 1976 Gender: Female Account #: [id number] Procedure:                Colonoscopy Indications:              High risk colon cancer surveillance: Personal                            history of sessile serrated colon polyp (less than                            10 mm in size) with no dysplasia, Last colonoscopy:                            November 2018; history of diarrhea and lower                            abdominal pain which has resolved between office                            visit and this procedure Medicines:                Monitored Anesthesia Care Procedure:                Pre-Anesthesia Assessment:                           - Prior to the procedure, a History and Physical                            was performed, and patient medications and                            allergies were reviewed. The patient's tolerance of                            previous anesthesia was also reviewed. The risks                            and benefits of the procedure and the sedation                            options and risks were discussed with the patient.                            All questions were answered, and informed consent                            was obtained. Prior Anticoagulants: The patient has                            taken no previous anticoagulant or antiplatelet  agents. ASA Grade Assessment: II - A patient with                            mild systemic disease. After reviewing the risks                            and benefits, the patient was deemed in                            satisfactory condition to undergo the procedure.                           After obtaining informed consent, the colonoscope                            was passed under direct vision. Throughout the                             procedure, the patient's blood pressure, pulse, and                            oxygen saturations were monitored continuously. The                            Olympus PCF-H190DL ([id number]) Colonoscope was                            introduced through the anus and advanced to the                            cecum, identified by appendiceal orifice and                            ileocecal valve. The colonoscopy was performed                            without difficulty. The patient tolerated the                            procedure well. The quality of the bowel                            preparation was good. The ileocecal valve,                            appendiceal orifice, and rectum were photographed. Scope In: 2:37:36 PM Scope Out: 2:55:21 PM Scope Withdrawal Time: 0 hours 14 minutes 10 seconds  Total Procedure Duration: 0 hours 17 minutes 45 seconds  Findings:                 The digital rectal exam was normal.                           A 8 mm polyp was found in the transverse colon. The  polyp was sessile. The polyp was removed with a                            cold snare. Resection and retrieval were complete.                           A diffuse area of mild melanosis was found in the                            entire colon.                           The exam was otherwise without abnormality on                            direct and retroflexion views. Complications:            No immediate complications. Estimated Blood Loss:     Estimated blood loss was minimal. Impression:               - One 8 mm polyp in the transverse colon, removed                            with a cold snare. Resected and retrieved.                           - Melanosis in the colon.                           - The examination was otherwise normal on direct                            and retroflexion views. Recommendation:           - Patient has a  contact number available for                            emergencies. The signs and symptoms of potential                            delayed complications were discussed with the                            patient. Return to normal activities tomorrow.                            Written discharge instructions were provided to the                            patient.                           - Resume previous diet.                           - Continue present medications.                           -  Await pathology results.                           - Repeat colonoscopy is recommended for                            surveillance. The colonoscopy date will be                            determined after pathology results from today's                            exam become available for review. Jerene Bears, MD 06/12/2021 3:02:41 PM This report has been signed electronically.

## 2021-06-12 NOTE — Progress Notes (Signed)
Pt's states no medical or surgical changes since previsit or office visit. 

## 2021-06-16 ENCOUNTER — Telehealth: Payer: Self-pay

## 2021-06-16 NOTE — Telephone Encounter (Signed)
  Follow up Call-  Call back number 06/12/2021  Post procedure Call Back phone  # [phone]  Permission to leave phone message Yes  Some recent data might be hidden     Patient questions:  Do you have a fever, pain , or abdominal swelling? No. Pain Score  0 *  Have you tolerated food without any problems? Yes.    Have you been able to return to your normal activities? Yes.    Do you have any questions about your discharge instructions: Diet   No. Medications  No. Follow up visit  No.  Do you have questions or concerns about your Care? No.  Actions: * If pain score is 4 or above: No action needed, pain <4.  Per pt she still has a nagging sore throat.  She said no pain, but knows she had her throat stretched.  I advised her to try warm water gargle and tylenol if needed.  Sh has been using Chloraceptic spray.  I explained we did stretch throat it was not uncommon to have a scratchy sore throat a few days.  Pt will continue what she is doing.  She is swallowing better and have advanced to her regular diet.  Pt will call us if sx do not resolve. Maw    Have you developed a fever since your procedure? no  2.   Have you had an respiratory symptoms (SOB or cough) since your procedure? no  3.   Have you tested positive for COVID 19 since your procedure no  4.   Have you had any family members/close contacts diagnosed with the COVID 19 since your procedure?  no   If yes to any of these questions please route to Joylene John, RN and Joella Prince, RN

## 2021-06-18 ENCOUNTER — Encounter: Payer: Self-pay | Admitting: Internal Medicine

## 2022-09-25 ENCOUNTER — Encounter: Payer: Self-pay | Admitting: Internal Medicine

## 2022-09-25 ENCOUNTER — Telehealth: Payer: Self-pay | Admitting: Internal Medicine

## 2022-09-25 NOTE — Telephone Encounter (Signed)
Pt requesting a refill on xifaxan, she reports she is having the same symptoms she was having in August 2021. She was having lots of diarrhea at that point and reports she is about the same now. She has an appt scheduled for Nov but wanted to know if we coujld send in refill. Pt aware Dr. Norman Herrlich is out of the office and she will get a call when he returns. Please advise.

## 2022-09-25 NOTE — Telephone Encounter (Signed)
Inbound call from patient requesting a refill on xifaxan. She has appt with [facility] 11/16 but states her symptoms are getting worse. Please advise.

## 2022-09-28 ENCOUNTER — Other Ambulatory Visit: Payer: Self-pay

## 2022-09-28 MED ORDER — RIFAXIMIN 550 MG PO TABS: 550.0000 mg | ORAL_TABLET | Freq: Three times a day (TID) | ORAL | 0 refills | Status: DC

## 2022-09-28 NOTE — Telephone Encounter (Signed)
Refill sent to pharmacy as requested

## 2022-09-28 NOTE — Telephone Encounter (Signed)
Yes can refill for IBS-D Rifaximin 550 mg TID x 14 days

## 2022-10-21 NOTE — Progress Notes (Unsigned)
10/21/2022 Autumn Beasley [id number] 01/16/1976   Chief Complaint: Abdominal pain   History of Present Illness: Autumn Beasley is a 46 year old female with a past medical history of asthma, Lyme disease 2017, migraine headaches, Crest Syndrome, IBS, anal fissure, hemorrhoids and GERD. Past hysterectomy.  She is known by Dr. Hilarie Fredrickson.  She presents today with complaints of upper abdominal pain which started 2 months ago but has significantly improved over the past month.  She started taking Mounjaro for weight loss approximately 3 months ago and she thought this medication may be contributing to her GI symptoms so she stopped taking it 1 month ago.  She contacted Dr. Hilarie Fredrickson 09/28/2022 with complaints of having abdominal pain with some nonbloody diarrhea and she was prescribed Xifaxan '550mg'$  po tid x 14 days as she had similar symptoms 07/2020 which resolved after she took [place].  She completed about 10 days of Xifaxan and then stopped it because she developed significant constipation.  Since then, she has resumed her normal bowel pattern which consist of passing a formed brown bowel movement every other day.  No rectal bleeding or black stools.  She complains of chronic early satiety.  She feels full after a few bites of food.  No nausea or vomiting.  She sometimes has epigastric pain and she feels full.  She reported having the same symptoms prior to her her EGD which was done 06/12/2021 which showed a normal stomach and a nonobstructing Schatzki's ring which was dilated.  She previously had dysphagia symptoms which significantly improved after her esophagus was dilated.  Since then, she intermittently has difficulty swallowing pills (magnesium tablets) and infrequently feels as if food might pass down the esophagus slowly but overall the symptoms are much better since her esophagus was dilated and are definitely not worse.  She does not wish to pursue an esophageal manometry at this time. Her most recent  colonoscopy was 06/12/2021 and 1 sessile serrated polyp was removed from the transverse colon and there was evidence of melanosis.  She was advised to repeat a colonoscopy in 5 years.      Latest Ref Rng & Units 11/15/2020   10:44 AM 06/29/2020    1:45 PM 06/24/2020    9:55 AM  CBC  WBC 4.0 - 10.5 K/uL  3.9  4.5   Hemoglobin 12.0 - 15.0 g/dL 17.3  15.4  16.0   Hematocrit 36.0 - 46.0 % 51.0  46.9  47.3   Platelets 150 - 400 K/uL  248  250.0        Latest Ref Rng & Units 11/15/2020   10:44 AM 06/29/2020    1:45 PM 06/24/2020    9:55 AM  CMP  Glucose 70 - 99 mg/dL 80  98  99   BUN 6 - 20 mg/dL '8  6  16   '$ Creatinine 0.44 - 1.00 mg/dL 0.80  0.94  1.02   Sodium 135 - 145 mmol/L 139  139  140   Potassium 3.5 - 5.1 mmol/L 3.7  4.2  4.5   Chloride 98 - 111 mmol/L 104  108  108   CO2 22 - 32 mmol/L  22  26   Calcium 8.9 - 10.3 mg/dL  9.3  9.8   Total Protein 6.5 - 8.1 g/dL  7.3  7.5   Total Bilirubin 0.3 - 1.2 mg/dL  0.5  0.5   Alkaline Phos 38 - 126 U/L  51  45   AST 15 -  41 U/L  55  17   ALT 0 - 44 U/L  87  17     EGD 06/12/2021: - Non-obstructing Schatzki ring. Esophageal dilation performed with 52 Fr Maloney. - Normal stomach. - Normal examined duodenum. - No specimens collected.  Colonoscopy 06/12/2021: - One 8 mm polyp in the transverse colon, removed with a cold snare. Resected and retrieved. - Melanosis in the colon. - The examination was otherwise normal on direct and retroflexion views. - 5 year colonoscopy  SESSILE SERRATED POLYP(S) WITHOUT CYTOLOGIC DYSPLASIA  Colonoscopy 11/04/2017: - The examined portion of the ileum was normal. - One 8 mm sessile serrated polyp in the transverse colon, removed with a cold snare. - Small external and internal hemorrhoids. - Biopsies were taken with a cold forceps from the right colon and left colon for evaluation of microscopic colitis. - Recall colonoscopy 3 years due to the size of the removed polyp and young age.   1. Surgical  [P], random right and left colon sites - BENIGN COLONIC MUCOSA. - NO ACTIVE INFLAMMATION OR EVIDENCE OF MICROSCOPIC COLITIS. - NO DYSPLASIA OR MALIGNANCY. 2. Surgical [P], transverse, polyp - SESSILE SERRATED POLYP (X4 FRAGMENTS). - NO DYSPLASIA OR MALIGNANCY.  Current Outpatient Medications on File Prior to Visit  Medication Sig Dispense Refill   DULoxetine (CYMBALTA) 20 MG capsule Take 20 mg by mouth at bedtime.      Hyoscyamine Sulfate SL (LEVSIN/SL) 0.125 MG SUBL Place 0.125 mg under the tongue every 4 (four) hours as needed (abdominal pain). 30 tablet 0   Magnesium 500 MG CAPS Take by mouth at bedtime.     rifaximin (XIFAXAN) 550 MG TABS tablet Take 1 tablet (550 mg total) by mouth 3 (three) times daily. 42 tablet 0   tiZANidine (ZANAFLEX) 4 MG tablet Take 4 mg by mouth every 6 (six) hours as needed for muscle spasms.     topiramate (TOPAMAX) 100 MG tablet Take 100 mg by mouth at bedtime.     No current facility-administered medications on file prior to visit.   Allergies  Allergen Reactions   Morphine And Related Anaphylaxis   Penicillins Rash    Has patient had a PCN reaction causing immediate rash, facial/tongue/throat swelling, SOB or lightheadedness with hypotension: No Has patient had a PCN reaction causing severe rash involving mucus membranes or skin necrosis: No Has patient had a PCN reaction that required hospitalization No Has patient had a PCN reaction occurring within the last 10 years: No If all of the above answers are "NO", then may proceed with Cephalosporin use.    Current Medications, Allergies, Past Medical History, Past Surgical History, Family History and Social History were reviewed in [employer] record.  Review of Systems:   Constitutional: Negative for fever, sweats, chills or weight loss.  Respiratory: Negative for shortness of breath.   Cardiovascular: Negative for chest pain, palpitations and leg swelling.  Gastrointestinal:  See HPI.  Musculoskeletal: Negative for back pain or muscle aches.  Neurological: Negative for dizziness, headaches or paresthesias.   Physical Exam: BP 114/78   Pulse 68   Ht '5\' 3"'$  (1.6 m)   Wt 207 lb 8 oz (94.1 kg)   SpO2 96%   BMI 36.76 kg/m   Wt Readings from Last 3 Encounters:  10/22/22 207 lb 8 oz (94.1 kg)  06/12/21 189 lb (85.7 kg)  03/20/21 189 lb (85.7 kg)    General: 46 year old female in NAD.  Head: Normocephalic and atraumatic. Eyes: No  scleral icterus. Conjunctiva pink . Ears: Normal auditory acuity. Mouth: Dentition intact. No ulcers or lesions.  Lungs: Clear throughout to auscultation. Heart: Regular rate and rhythm, no murmur. Abdomen: Soft, nondistended. No masses or hepatomegaly. Normal bowel sounds x 4 quadrants.  Rectal: Deferred.  Musculoskeletal: Symmetrical with no gross deformities. Extremities: No edema. Neurological: Alert oriented x 4. No focal deficits.  Psychological: Alert and cooperative. Normal mood and affect  Assessment and Recommendations:  53) 46 year old female with upper abdominal pain which started after taking Mounjaro (for weight loss) x 2 months which improved after she stopped taking this medication 1 month ago.  She also took 10 days of Xifaxan '550mg'$  po tid only which may have also contributed to her symptom relief.  EGD 06/2021 showed a normal stomach and duodenum. -Remain off Mounjaro -CBC, CMP and lipase level  2) Chronic early satiety and intermittent epigastric pain. No N/V. Past cholecystectomy.  EGD 06/2021 showed a normal stomach and duodenum. -Recommended patient to eat 3-4 small snack meals daily, avoid eating within 3 hours before going to bed -Famotidine '20mg'$  one tab po QD OTC -Fasting glucose level -Gastric emptying study to rule out gastroparesis -CTAP if she develops significant abdominal pain, to the ED if she develops severe abdominal pain  3) Diarrhea resolved, developed worsening constipation after taking  Xifaxan  4) Chronic pill dysphagia.  Solid food dysphagia versus dysmotility disorder significantly improved but did not completely abate post EGD with esophageal dilatation 06/2021. -She may require repeat EGD with esophageal dilatation -If dysphagia symptoms persist consider esophageal manometry in the setting of CREST syndrome as noted by Dr. Hilarie Fredrickson post EGD.  Patient does not wish to pursue esophageal manometry at this time. -Patient will contact her office if her symptoms worsen  5) History of sessile serrated colon polyps -Next colon polyp surveillance colonoscopy due 06/2026

## 2022-10-22 ENCOUNTER — Other Ambulatory Visit (INDEPENDENT_AMBULATORY_CARE_PROVIDER_SITE_OTHER): Payer: BC Managed Care – PPO

## 2022-10-22 ENCOUNTER — Ambulatory Visit (INDEPENDENT_AMBULATORY_CARE_PROVIDER_SITE_OTHER): Payer: BC Managed Care – PPO | Admitting: Nurse Practitioner

## 2022-10-22 ENCOUNTER — Encounter: Payer: Self-pay | Admitting: Nurse Practitioner

## 2022-10-22 VITALS — BP 114/78 | HR 68 | Ht 63.0 in | Wt 207.5 lb

## 2022-10-22 DIAGNOSIS — R6881 Early satiety: Secondary | ICD-10-CM

## 2022-10-22 DIAGNOSIS — K5900 Constipation, unspecified: Secondary | ICD-10-CM

## 2022-10-22 DIAGNOSIS — R1010 Upper abdominal pain, unspecified: Secondary | ICD-10-CM

## 2022-10-22 LAB — CBC WITH DIFFERENTIAL/PLATELET
Basophils Absolute: 0.1 10*3/uL (ref 0.0–0.1)
Basophils Relative: 1.1 % (ref 0.0–3.0)
Eosinophils Absolute: 0.2 10*3/uL (ref 0.0–0.7)
Eosinophils Relative: 3.1 % (ref 0.0–5.0)
HCT: 48.3 % — ABNORMAL HIGH (ref 36.0–46.0)
Hemoglobin: 16.5 g/dL — ABNORMAL HIGH (ref 12.0–15.0)
Lymphocytes Relative: 22.6 % (ref 12.0–46.0)
Lymphs Abs: 1.1 10*3/uL (ref 0.7–4.0)
MCHC: 34.1 g/dL (ref 30.0–36.0)
MCV: 87.8 fl (ref 78.0–100.0)
Monocytes Absolute: 0.5 10*3/uL (ref 0.1–1.0)
Monocytes Relative: 8.9 % (ref 3.0–12.0)
Neutro Abs: 3.3 10*3/uL (ref 1.4–7.7)
Neutrophils Relative %: 64.3 % (ref 43.0–77.0)
Platelets: 221 10*3/uL (ref 150.0–400.0)
RBC: 5.5 Mil/uL — ABNORMAL HIGH (ref 3.87–5.11)
RDW: 14.3 % (ref 11.5–15.5)
WBC: 5.1 10*3/uL (ref 4.0–10.5)

## 2022-10-22 LAB — COMPREHENSIVE METABOLIC PANEL
ALT: 16 U/L (ref 0–35)
AST: 14 U/L (ref 0–37)
Albumin: 4.3 g/dL (ref 3.5–5.2)
Alkaline Phosphatase: 57 U/L (ref 39–117)
BUN: 16 mg/dL (ref 6–23)
CO2: 25 mEq/L (ref 19–32)
Calcium: 9.3 mg/dL (ref 8.4–10.5)
Chloride: 108 mEq/L (ref 96–112)
Creatinine, Ser: 0.97 mg/dL (ref 0.40–1.20)
GFR: 70.62 mL/min (ref >60.00)
Glucose, Bld: 88 mg/dL (ref 70–99)
Potassium: 4.3 mEq/L (ref 3.5–5.1)
Sodium: 139 mEq/L (ref 135–145)
Total Bilirubin: 0.5 mg/dL (ref 0.2–1.2)
Total Protein: 7.2 g/dL (ref 6.0–8.3)

## 2022-10-22 LAB — TSH: TSH: 1.27 u[IU]/mL (ref 0.35–5.50)

## 2022-10-22 LAB — LIPASE: Lipase: 31 U/L (ref 11.0–59.0)

## 2022-10-22 NOTE — Patient Instructions (Addendum)
You have been scheduled for a gastric emptying scan at [facility] on 10/28/22 at 10 am. Please arrive at least 15 minutes prior to your appointment for registration. Please make certain not to have anything to eat or drink after midnight the night before your test. Hold all stomach medications (ex: Zofran, phenergan, Reglan) 24 hours prior to your test. If you need to reschedule your appointment, please contact radiology scheduling at [phone]. _____________________________________________________________________ A gastric-emptying study measures how long it takes for food to move through your stomach. There are several ways to measure stomach emptying. In the most common test, you eat food that contains a small amount of radioactive material. A scanner that detects the movement of the radioactive material is placed over your abdomen to monitor the rate at which food leaves your stomach. This test normally takes about 4 hours to complete. _____________________________________________________________________   Merril Abbe- take every night as needed for constipation  Eat 3-4 snack sized meals.  Pepcid 20 mg (Over the counter)- 1 by mouth daily  Due to recent changes in healthcare laws, you may see the results of your imaging and laboratory studies on MyChart before your provider has had a chance to review them.  We understand that in some cases there may be results that are confusing or concerning to you. Not all laboratory results come back in the same time frame and the provider may be waiting for multiple results in order to interpret others.  Please give [place] 48 hours in order for your provider to thoroughly review all the results before contacting the office for clarification of your results.    Thank you for trusting me with your gastrointestinal care!   Carl Best, CRNP

## 2022-10-24 NOTE — Progress Notes (Signed)
Addendum: Reviewed and agree with assessment and management plan. Tametra Ahart M, MD  

## 2022-10-28 ENCOUNTER — Encounter (HOSPITAL_COMMUNITY)
Admission: RE | Admit: 2022-10-28 | Discharge: 2022-10-28 | Disposition: A | Discharge status: Home / Self Care | Payer: BC Managed Care – PPO | Source: Ambulatory Visit | Attending: Nurse Practitioner | Admitting: Nurse Practitioner

## 2022-10-28 DIAGNOSIS — K5900 Constipation, unspecified: Secondary | ICD-10-CM | POA: Diagnosis present

## 2022-10-28 DIAGNOSIS — R6881 Early satiety: Secondary | ICD-10-CM | POA: Diagnosis present

## 2022-10-28 DIAGNOSIS — R1010 Upper abdominal pain, unspecified: Secondary | ICD-10-CM | POA: Diagnosis present

## 2022-10-28 MED ORDER — TECHNETIUM TC 99M SULFUR COLLOID
2.0000 | Freq: Once | INTRAVENOUS | Status: AC | PRN
  Administered 2022-10-28: 2.2 via INTRAVENOUS

## 2023-03-10 ENCOUNTER — Emergency Department (HOSPITAL_COMMUNITY)
Admission: EM | Admit: 2023-03-10 | Discharge: 2023-03-10 | Discharge status: Against Medical Advice | Payer: BC Managed Care – PPO | Attending: Emergency Medicine | Admitting: Emergency Medicine

## 2023-03-10 ENCOUNTER — Other Ambulatory Visit: Payer: Self-pay

## 2023-03-10 ENCOUNTER — Emergency Department (HOSPITAL_COMMUNITY): Payer: BC Managed Care – PPO

## 2023-03-10 ENCOUNTER — Encounter (HOSPITAL_COMMUNITY): Payer: Self-pay | Admitting: Emergency Medicine

## 2023-03-10 DIAGNOSIS — R519 Headache, unspecified: Secondary | ICD-10-CM | POA: Diagnosis not present

## 2023-03-10 DIAGNOSIS — R079 Chest pain, unspecified: Secondary | ICD-10-CM

## 2023-03-10 DIAGNOSIS — Z5321 Procedure and treatment not carried out due to patient leaving prior to being seen by health care provider: Secondary | ICD-10-CM | POA: Diagnosis not present

## 2023-03-10 LAB — TROPONIN I (HIGH SENSITIVITY)
Troponin I (High Sensitivity): <2 ng/L (ref <18)
Troponin I (High Sensitivity): <2 ng/L (ref <18)

## 2023-03-10 LAB — BASIC METABOLIC PANEL
Anion gap: 9 (ref 5–15)
BUN: 12 mg/dL (ref 6–20)
CO2: 20 mmol/L — ABNORMAL LOW (ref 22–32)
Calcium: 8.8 mg/dL — ABNORMAL LOW (ref 8.9–10.3)
Chloride: 109 mmol/L (ref 98–111)
Creatinine, Ser: 0.91 mg/dL (ref 0.44–1.00)
GFR, Estimated: >60 mL/min (ref >60)
Glucose, Bld: 88 mg/dL (ref 70–99)
Potassium: 4 mmol/L (ref 3.5–5.1)
Sodium: 138 mmol/L (ref 135–145)

## 2023-03-10 LAB — CBC
HCT: 47.9 % — ABNORMAL HIGH (ref 36.0–46.0)
Hemoglobin: 16 g/dL — ABNORMAL HIGH (ref 12.0–15.0)
MCH: 30.1 pg (ref 26.0–34.0)
MCHC: 33.4 g/dL (ref 30.0–36.0)
MCV: 90 fL (ref 80.0–100.0)
Platelets: 253 10*3/uL (ref 150–400)
RBC: 5.32 MIL/uL — ABNORMAL HIGH (ref 3.87–5.11)
RDW: 13.8 % (ref 11.5–15.5)
WBC: 5 10*3/uL (ref 4.0–10.5)
nRBC: 0 % (ref 0.0–0.2)

## 2023-03-10 MED ORDER — PROCHLORPERAZINE EDISYLATE 10 MG/2ML IJ SOLN
10.0000 mg | Freq: Once | INTRAMUSCULAR | Status: AC
  Administered 2023-03-10: 10 mg via INTRAMUSCULAR
  Filled 2023-03-10: qty 2

## 2023-03-10 MED ORDER — DEXAMETHASONE 4 MG PO TABS
10.0000 mg | ORAL_TABLET | Freq: Once | ORAL | Status: AC
  Administered 2023-03-10: 10 mg via ORAL
  Filled 2023-03-10: qty 3

## 2023-03-10 MED ORDER — KETOROLAC TROMETHAMINE 30 MG/ML IJ SOLN
30.0000 mg | Freq: Once | INTRAMUSCULAR | Status: AC
  Administered 2023-03-10: 30 mg via INTRAMUSCULAR
  Filled 2023-03-10: qty 1

## 2023-03-10 NOTE — ED Triage Notes (Signed)
Pt c/o intermittent chest pain with headache x 3 weeks.

## 2023-03-10 NOTE — ED Provider Notes (Signed)
[place] EMERGENCY DEPARTMENT AT [facility] Provider Note   CSN: [id number] Arrival date & time: 03/10/23  1357     History Chief Complaint  Patient presents with   Chest Pain    Autumn Beasley is a 47 y.o. female with h/o migraines, CREST syndrome, IBS presents to the ER for evaluation of chest pain and headaches. She reports that she has been having a left temporal headache that goes to the back of her head for the past three weeks.  She reports it is squeezing in nature.  She reports that this does not feel like her migraines as she usually has an aura and its usually a diffuse headache. She reports that last night the headache was on the right side and she had some dry heaving yesterday as well. She reports that headaches last between 1-2 minutes and she has had 5-6 episodes in the past 3 weeks.  She denies any vision changes with these headaches or any trouble walking or talking.  She denies any syncopal episodes as well.  She has seen her PCP about this problem twice. The first time, she was given Toradol and Zofran. She reports it worked some. She was given Sumitriptan, but has only taken it once because she wanted to save it in case her headaches were "really bad". She was seen by her PCP again and was given a  follow up to neurology but the soonest appt was in June.  Additionally, she mentions that she has been having some bandlike tightness on her chest under her breast and around her back.  She reports that she had a few times today around 5-6 times.  It lasted less than 5 minutes and resolves.  She denies any shortness of breath, abdominal pain, fevers, recent cough or cold symptoms.  She denies any jaw claudication or any vision problems.  She was concerned with the chest pain however she thinks it may be her anxiety from the headaches.  Surgical history includes cholecystectomy and hysterectomy as well as tonsils and adenoids.  Daily medications include duloxetine,  tizanidine, gabapentin, and Topamax.  She is allergic to morphine and penicillin.  She denies any tobacco, EtOH illicit drug use ever.   Chest Pain Associated symptoms: headache and nausea   Associated symptoms: no abdominal pain, no cough, no dizziness, no fever, no numbness, no shortness of breath, no vomiting and no weakness        Home Medications Prior to Admission medications   Medication Sig Start Date End Date Taking? Authorizing Provider  DULoxetine (CYMBALTA) 20 MG capsule Take 20 mg by mouth at bedtime.     [provider]  Hyoscyamine Sulfate SL (LEVSIN/SL) 0.125 MG SUBL Place 0.125 mg under the tongue every 4 (four) hours as needed (abdominal pain). 06/29/20   Burgess Amor, PA-C  Magnesium 500 MG CAPS Take by mouth at bedtime.    [provider]  rifaximin (XIFAXAN) 550 MG TABS tablet Take 1 tablet (550 mg total) by mouth 3 (three) times daily. 09/28/22   Pyrtle, Carie Caddy, MD  tiZANidine (ZANAFLEX) 4 MG tablet Take 4 mg by mouth every 6 (six) hours as needed for muscle spasms.    [provider]  topiramate (TOPAMAX) 100 MG tablet Take 100 mg by mouth at bedtime.    [provider]      Allergies    Morphine and related and Penicillins    Review of Systems   Review of Systems  Constitutional:  Negative for chills and fever.  HENT:  Negative for congestion, rhinorrhea and sore throat.   Eyes:  Negative for photophobia and visual disturbance.  Respiratory:  Negative for cough and shortness of breath.   Cardiovascular:  Positive for chest pain.  Gastrointestinal:  Positive for nausea. Negative for abdominal pain, constipation, diarrhea and vomiting.  Genitourinary:  Negative for dysuria and hematuria.  Musculoskeletal:  Negative for gait problem, neck pain and neck stiffness.  Neurological:  Positive for headaches. Negative for dizziness, speech difficulty, weakness, light-headedness and numbness.    Physical Exam Updated Vital Signs BP  119/82 (BP Location: Left Arm)   Pulse 69   Temp 97.7 F (36.5 C) (Oral)   Resp 15   Ht 5\' 3"  (1.6 m)   Wt 99.8 kg   SpO2 100%   BMI 38.97 kg/m  Physical Exam Vitals and nursing note reviewed.  Constitutional:      General: She is not in acute distress.    Appearance: Normal appearance. She is not ill-appearing or toxic-appearing.  HENT:     Head: Normocephalic and atraumatic.     Comments: No battle signs or raccoon eyes.  Temples and scalp are nontender to palpation. Eyes:     General: No scleral icterus.    Extraocular Movements: Extraocular movements intact.     Pupils: Pupils are equal, round, and reactive to light.  Neck:     Comments: Full range of motion without pain.  No meningeal signs. Cardiovascular:     Rate and Rhythm: Normal rate and regular rhythm.     Pulses:          Radial pulses are 2+ on the right side and 2+ on the left side.       Dorsalis pedis pulses are 2+ on the right side and 2+ on the left side.       Posterior tibial pulses are 2+ on the right side and 2+ on the left side.  Pulmonary:     Effort: Pulmonary effort is normal. No respiratory distress.  Chest:     Chest wall: No tenderness.  Abdominal:     General: Abdomen is flat. Bowel sounds are normal.     Palpations: Abdomen is soft.  Musculoskeletal:        General: No deformity.     Cervical back: Normal range of motion.     Right lower leg: No tenderness. No edema.     Left lower leg: No tenderness. No edema.  Skin:    General: Skin is warm and dry.  Neurological:     General: No focal deficit present.     Mental Status: She is alert. Mental status is at baseline.     GCS: GCS eye subscore is 4. GCS verbal subscore is 5. GCS motor subscore is 6.     Cranial Nerves: No cranial nerve deficit, dysarthria or facial asymmetry.     Sensory: No sensory deficit.     Motor: No weakness or pronator drift.     Coordination: Finger-Nose-Finger Test normal.     ED Results / Procedures /  Treatments   Labs (all labs ordered are listed, but only abnormal results are displayed) Labs Reviewed  BASIC METABOLIC PANEL - Abnormal; Notable for the following components:      Result Value   CO2 20 (*)    Calcium 8.8 (*)    All other components within normal limits  CBC - Abnormal; Notable for the following components:   RBC  5.32 (*)    Hemoglobin 16.0 (*)    HCT 47.9 (*)    All other components within normal limits  TROPONIN I (HIGH SENSITIVITY)  TROPONIN I (HIGH SENSITIVITY)    EKG EKG Interpretation  Date/Time:  Wednesday March 10 2023 14:10:54 EDT Ventricular Rate:  68 PR Interval:  168 QRS Duration: 88 QT Interval:  380 QTC Calculation: 404 R Axis:   84 Text Interpretation: Normal sinus rhythm Normal ECG Confirmed by Cathren LaineSteinl, Kevin ([id number]) on 03/10/2023 2:32:49 PM  Radiology CT Head Wo Contrast  Result Date: 03/10/2023 CLINICAL DATA:  Headache for the past 3 weeks. EXAM: CT HEAD WITHOUT CONTRAST TECHNIQUE: Contiguous axial images were obtained from the base of the skull through the vertex without intravenous contrast. RADIATION DOSE REDUCTION: This exam was performed according to the departmental dose-optimization program which includes automated exposure control, adjustment of the mA and/or kV according to patient size and/or use of iterative reconstruction technique. COMPARISON:  CT head dated October 24, 2020. FINDINGS: Brain: No evidence of acute infarction, hemorrhage, hydrocephalus, extra-axial collection or mass lesion/mass effect. Vascular: No hyperdense vessel or unexpected calcification. Skull: Normal. Negative for fracture or focal lesion. Sinuses/Orbits: No acute finding. Other: None. IMPRESSION: 1. No acute intracranial abnormality. Electronically Signed   By: Obie DredgeWilliam T Derry M.D.   On: 03/10/2023 15:25   DG Chest 2 View  Result Date: 03/10/2023 CLINICAL DATA:  Intermittent chest pain with headaches for 3 weeks EXAM: CHEST - 2 VIEW COMPARISON:  None Available.  FINDINGS: Normal heart size, mediastinal contours, and pulmonary vascularity. Lungs clear. No pleural effusion or pneumothorax. Bones unremarkable. IMPRESSION: Normal exam. Electronically Signed   By: Ulyses SouthwardMark  Boles M.D.   On: 03/10/2023 14:30    Procedures Procedures   Medications Ordered in ED Medications  prochlorperazine (COMPAZINE) injection 10 mg (10 mg Intramuscular Given 03/10/23 1947)  dexamethasone (DECADRON) tablet 10 mg (10 mg Oral Given 03/10/23 1947)  ketorolac (TORADOL) 30 MG/ML injection 30 mg (30 mg Intramuscular Given 03/10/23 1947)    ED Course/ Medical Decision Making/ A&P    Medical Decision Making Amount and/or Complexity of Data Reviewed Labs: ordered. Radiology: ordered.  Risk Prescription drug management.   47 y.o. female presents to the ER today for evaluation of headaches and chest pain intermittently over the past 3 weeks. Differential diagnosis includes but is not limited to Stroke, increased ICP, meningitis, CVA, intracranial tumor, venous sinus thrombosis, migraine, cluster headache, hypertension, drug related, head injury, tension headache, sinusitis, dental abscess, otitis media, TMJ, temporal arteritis, glaucoma, trigeminal neuralgia, ACS, pericarditis, myocarditis, aortic dissection, PE, pneumothorax, esophageal spasm or rupture, chronic angina, pneumonia, bronchitis, GERD, reflux/PUD, biliary disease, pancreatitis, costochondritis, anxiety. Vital signs unremarkable. Physical exam as noted above.   I independently reviewed and interpreted the patient's labs.  CBC shows elevated hemoglobin at 16 with an elevated hematocrit as well.  This seems to be around patient's baseline.  She knows about this and reports that her primary care doctor is also aware of it and is thought about referring her to a hematologist.  No leukocytosis.  Normal platelets.  BMP shows mildly decreased bicarb at 20.  Mildly decreased calcium at 8.8.  Troponin is less than 2.  EKG reviewed and  interpreted to my attending and read as normal sinus rhythm.  Chest x-ray shows no acute cardiopulmonary process.  CT head shows no acute intracranial abnormality.  Neuro paged out twice without call back.  Patient alerted nursing that she was leaving and asked that the provider  call her. I called the patient and confirmed name and DOB before discussion that this would be considered elopement and that I could not discuss neurology's recommendation given that she left. Advised her that she can come back in the morning or later today if she wanted to continue her care.  She verbalized understanding.   Final Clinical Impression(s) / ED Diagnoses Final diagnoses:  Nonspecific chest pain  Bad headache  Eloped from emergency department    Rx / DC Orders ED Discharge Orders     None         Achille Rich, Cordelia Poche 03/14/23 1804    Lonell Grandchild, MD 03/19/23 [phone]

## 2023-03-10 NOTE — ED Provider Triage Note (Signed)
Emergency Medicine Provider Triage Evaluation Note  Autumn Beasley , a 47 y.o. female  was evaluated in triage.  Pt complains of chest pain and persistent worse than normal migraine headache which has not responded regular otc meds and migraine cocktail per IV past week.  Headache worsening over past 3 weeks, intermitteint cp since then, tight band across anterior chest.   Review of Systems  Positive: Headache, chest pain Negative: Fevers, cough, sob, focal weakness  Physical Exam  BP 139/63 (BP Location: Right Arm)   Pulse 73   Temp 98 F (36.7 C) (Oral)   Resp 18   Ht 5\' 3"  (1.6 m)   Wt 99.8 kg   SpO2 98%   BMI 38.97 kg/m  Gen:   Awake, no distress   Resp:  Normal effort  MSK:   Moves extremities without difficulty  Other:    Medical Decision Making  Medically screening exam initiated at 2:54 PM.  Appropriate orders placed.  Autumn Beasley was informed that the remainder of the evaluation will be completed by another provider, this initial triage assessment does not replace that evaluation, and the importance of remaining in the ED until their evaluation is complete.  Labs and imaging ordered.    Evalee Jefferson, PA-C 03/10/23 1456

## 2023-03-10 NOTE — ED Notes (Signed)
Pt seen walking out of door by this RN.

## 2023-03-19 ENCOUNTER — Ambulatory Visit (INDEPENDENT_AMBULATORY_CARE_PROVIDER_SITE_OTHER): Payer: BC Managed Care – PPO | Admitting: Psychiatry

## 2023-03-19 ENCOUNTER — Encounter: Payer: Self-pay | Admitting: Psychiatry

## 2023-03-19 VITALS — BP 135/91 | HR 82 | Ht 63.0 in | Wt 221.0 lb

## 2023-03-19 DIAGNOSIS — G4453 Primary thunderclap headache: Secondary | ICD-10-CM | POA: Diagnosis not present

## 2023-03-19 MED ORDER — TOPIRAMATE 100 MG PO TABS: ORAL_TABLET | ORAL | 6 refills | Status: AC

## 2023-03-19 NOTE — Progress Notes (Signed)
Referring:  Orlene Plum, NP [address] [place],  [place] [address]  PCP: Ignatius Specking, MD  Neurology was asked to evaluate Autumn Beasley, a 47 year old female for a chief complaint of headaches.  Our recommendations of care will be communicated by shared medical record.    CC:  headaches  History provided from self  HPI:  Medical co-morbidities: CREST syndrome, IBS, migraines  The patient presents for evaluation of headaches. 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 Headaches are occurring almost every day. Current headaches are described as sudden severe pain (feels like "getting hit with a hammer") in her left vertex and temple. This lasts for a few seconds at a time. She will typically have 2-3 of these in a row, which is then followed by a sensation of scalp tenderness and vice-like squeezing around her head. Squeezing headache can last for several hours at a time. Her most severe ones have been associated with nausea and dry heaving. Headaches have been waking her up from sleep at night. She has not been able to identify any triggers for her headaches.   She was prescribed sumatriptan by her PCP which did help relieve the vice-like headache after a few hours. Also tried Nurtec for rescue but did not find this effective.  Headache History: Onset: 3 weeks ago Aura: none Location: left temple/vertex Associated Symptoms:  Photophobia: no  Phonophobia: no  Nausea: yes Worse with activity?: yes Duration of headaches: seconds-1 minute  Headache days per month: 30 Headache free days per month: 0  Current Treatment: Abortive Imitrex  Preventative Topamax 100 mg QHS  Prior Therapies                                 Rescue: Imitrex Nurtec - lack of efficacy Tizanidine 4 mg PRN  Prevention: Cymbalta 30 mg QHS Gabapentin 300 mg QHS -  fatigue Topamax 100 mg QHS   LABS: CBC    Component Value Date/Time   WBC 5.0 03/10/2023 1431   RBC 5.32 (H) 03/10/2023 1431   HGB 16.0 (H) 03/10/2023 1431   HCT 47.9 (H) 03/10/2023 1431   PLT 253 03/10/2023 1431   MCV 90.0 03/10/2023 1431   MCH 30.1 03/10/2023 1431   MCHC 33.4 03/10/2023 1431   RDW 13.8 03/10/2023 1431   LYMPHSABS 1.1 10/22/2022 0955   MONOABS 0.5 10/22/2022 0955   EOSABS 0.2 10/22/2022 0955   BASOSABS 0.1 10/22/2022 0955      Latest Ref Rng & Units 03/10/2023    2:31 PM 10/22/2022    9:55 AM 11/15/2020   10:44 AM  CMP  Glucose 70 - 99 mg/dL 88  88  80   BUN 6 - 20 mg/dL 12  16  8    Creatinine 0.44 - 1.00 mg/dL 6.04  5.40  9.81   Sodium 135 - 145 mmol/L 138  139  139   Potassium 3.5 - 5.1 mmol/L 4.0  4.3  3.7   Chloride 98 - 111 mmol/L 109  108  104   CO2 22 - 32 mmol/L 20  25    Calcium 8.9 - 10.3 mg/dL 8.8  9.3    Total Protein 6.0 - 8.3 g/dL  7.2    Total Bilirubin 0.2 - 1.2 mg/dL  0.5  Alkaline Phos 39 - 117 U/L  57    AST 0 - 37 U/L  14    ALT 0 - 35 U/L  16       IMAGING:  CTH 03/10/23: unremarkable  Imaging independently reviewed on March 19, 2023   Current Outpatient Medications on File Prior to Visit  Medication Sig Dispense Refill   DULoxetine (CYMBALTA) 20 MG capsule Take 20 mg by mouth at bedtime.      gabapentin (NEURONTIN) 300 MG capsule Take 300 mg by mouth 3 (three) times daily.     hydroxychloroquine (PLAQUENIL) 200 MG tablet Take 200 mg by mouth daily.     Hyoscyamine Sulfate SL (LEVSIN/SL) 0.125 MG SUBL Place 0.125 mg under the tongue every 4 (four) hours as needed (abdominal pain). 30 tablet 0   Magnesium 500 MG CAPS Take by mouth at bedtime.     tiZANidine (ZANAFLEX) 4 MG tablet Take 4 mg by mouth every 6 (six) hours as needed for muscle spasms.     No current facility-administered medications on file prior to visit.     Allergies: Allergies  Allergen Reactions   Morphine And Related Anaphylaxis   Penicillins Rash     Has patient had a PCN reaction causing immediate rash, facial/tongue/throat swelling, SOB or lightheadedness with hypotension: No Has patient had a PCN reaction causing severe rash involving mucus membranes or skin necrosis: No Has patient had a PCN reaction that required hospitalization No Has patient had a PCN reaction occurring within the last 10 years: No If all of the above answers are "NO", then may proceed with Cephalosporin use.     Family History: Family History  Problem Relation Age of Onset   Crohn's disease Sister    Colon polyps Sister    Diabetes Maternal Aunt    Liver disease Maternal Aunt    COPD Maternal Aunt    Diabetes Maternal Grandmother    Alcohol abuse Maternal Grandfather    Colon cancer Neg Hx    Rectal cancer Neg Hx    Stomach cancer Neg Hx    Esophageal cancer Neg Hx    Pancreatic cancer Neg Hx    Grandmother passed away from an aneurysm  Past Medical History: Past Medical History:  Diagnosis Date   Anal fissure    CREST syndrome    rheumotology--- dr Dorathy Kinsman ([facility])  treated w/ plaquenil   Family history of adverse reaction to anesthesia    mother--- ponv   Gastropathy    GERD (gastroesophageal reflux disease)    Hemorrhoids    internal and external   Hemorrhoids    IBS (irritable bowel syndrome)    IBS (irritable bowel syndrome)    Lyme disease 2017   per pt dx 2017 w/ possible residual but unsure due to CREST syndrome   Migraine    PONV (postoperative nausea and vomiting)    severe    Past Surgical History Past Surgical History:  Procedure Laterality Date   DILATION AND CURETTAGE OF UTERUS  2003   EVALUATION UNDER ANESTHESIA WITH HEMORRHOIDECTOMY N/A 11/15/2020   Procedure: EXAM UNDER ANESTHESIA WITH HEMORRHOIDPEXY,;  Surgeon: Karie Soda, MD;  Location: [facility];  Service: General;  Laterality: N/A;   LAPAROSCOPIC CHOLECYSTECTOMY  1997   SPHINCTEROTOMY N/A 11/15/2020   Procedure: SPHINCTEROTOMY;   Surgeon: Karie Soda, MD;  Location: [facility] [place];  Service: General;  Laterality: N/A;   TONSILLECTOMY  child   W/   BILATERAL TYMPANOPLASTY W/ TUBES  VAGINAL HYSTERECTOMY  2005   W/ UNILATERAL SALPINGOOPHORECTOMY    Social History: Social History   Tobacco Use   Smoking status: Never   Smokeless tobacco: Never  Vaping Use   Vaping Use: Never used  Substance Use Topics   Alcohol use: No   Drug use: Never    ROS: Negative for fevers, chills. Positive for headaches. All other systems reviewed and negative unless stated otherwise in HPI.   Physical Exam:   Vital Signs: BP (!) 135/91   Pulse 82   Ht  (1.6 m)   Wt 221 lb (100.2 kg)   BMI 39.15 kg/m  GENERAL: well appearing,in no acute distress,alert SKIN:  Color, texture, turgor normal. No rashes or lesions HEAD:  Normocephalic/atraumatic. CV:  RRR RESP: Normal respiratory effort MSK: no tenderness to palpation over temple, occiput, neck, or shoulders  NEUROLOGICAL: Mental Status: Alert, oriented to person, place and time,Follows commands Cranial Nerves: PERRL, visual fields intact to confrontation, extraocular movements intact, facial sensation intact, no facial droop or ptosis, hearing grossly intact, no dysarthria Motor: muscle strength 5/5 both upper and lower extremities Reflexes: 2+ throughout Sensation: intact to light touch all 4 extremities Coordination: Finger-to- nose-finger intact bilaterally Gait: normal-based   IMPRESSION: 47 year old female with a history of CREST syndrome, IBS, migraines who presents for evaluation of new severe headaches. Will order brain MRI and CTA head to rule out structural causes of thunderclap headache including aneurysm. If imaging is normal, headaches may represent primary stabbing headache. Will increase Topamax to 50/100 for headache prevention.  PLAN: -MRI brain, CTA head -Prevention: Increase Topamax to 50/100 -Rescue: Continue Imitrex for  now -Next steps: Consider indomethacin or increasing Cymbalta dose for headache prevention. Patient would prefer not to increase gabapentin as it causes drowsiness.   I spent a total of 36 minutes chart reviewing and counseling the patient. Headache education was done. Discussed treatment options including preventive and acute medications. Discussed medication side effects, adverse reactions and drug interactions. Written educational materials and patient instructions outlining all of the above were given.  Follow-up: 6 months   Ocie Doyne, MD 03/19/2023   11:36 AM

## 2023-03-22 ENCOUNTER — Encounter: Payer: Self-pay | Admitting: Psychiatry

## 2023-03-23 ENCOUNTER — Telehealth: Payer: Self-pay | Admitting: Psychiatry

## 2023-03-23 NOTE — Telephone Encounter (Signed)
[employer] NPR sent to [employer] [phone]

## 2023-03-24 ENCOUNTER — Other Ambulatory Visit: Payer: Self-pay | Admitting: Psychiatry

## 2023-03-24 DIAGNOSIS — G4453 Primary thunderclap headache: Secondary | ICD-10-CM

## 2023-04-09 ENCOUNTER — Ambulatory Visit (HOSPITAL_COMMUNITY)
Admission: RE | Admit: 2023-04-09 | Discharge: 2023-04-09 | Disposition: A | Discharge status: Home / Self Care | Payer: BC Managed Care – PPO | Source: Ambulatory Visit | Attending: Psychiatry | Admitting: Psychiatry

## 2023-04-09 DIAGNOSIS — G4453 Primary thunderclap headache: Secondary | ICD-10-CM

## 2023-04-09 MED ORDER — SODIUM CHLORIDE (PF) 0.9 % IJ SOLN
INTRAMUSCULAR | Status: AC
  Filled 2023-04-09: qty 50

## 2023-04-09 MED ORDER — IOHEXOL 350 MG/ML SOLN
80.0000 mL | Freq: Once | INTRAVENOUS | Status: AC | PRN
  Administered 2023-04-09: 80 mL via INTRAVENOUS

## 2023-04-09 MED ORDER — GADOBUTROL 1 MMOL/ML IV SOLN
10.0000 mL | Freq: Once | INTRAVENOUS | Status: AC | PRN
  Administered 2023-04-09: 10 mL via INTRAVENOUS

## 2023-04-14 ENCOUNTER — Ambulatory Visit: Payer: BC Managed Care – PPO | Admitting: Neurology

## 2023-04-14 ENCOUNTER — Encounter: Payer: Self-pay | Admitting: *Deleted

## 2023-04-19 ENCOUNTER — Telehealth: Payer: Self-pay | Admitting: *Deleted

## 2023-04-19 NOTE — Telephone Encounter (Signed)
-----   Message from Arther Abbott, RN sent at 04/14/2023  4:22 PM EDT -----  ----- Message ----- From: Suanne Marker, MD Sent: 04/14/2023   4:20 PM EDT To: Arther Abbott, RN  Borderline low cerebellar tonsils is a non-specific finding (nothing further to address). Rest of MRI brain is ok. Aneurysm and bleeding in brain are ruled out. Continue current plan per Dr. Delena Bali to treat as primary thunderclap headaches. -VRP

## 2023-04-23 ENCOUNTER — Ambulatory Visit (HOSPITAL_COMMUNITY): Payer: BC Managed Care – PPO

## 2023-05-14 ENCOUNTER — Ambulatory Visit: Payer: BC Managed Care – PPO | Admitting: Diagnostic Neuroimaging

## 2023-05-19 ENCOUNTER — Other Ambulatory Visit (HOSPITAL_COMMUNITY): Payer: BC Managed Care – PPO

## 2023-07-29 LAB — COMPREHENSIVE METABOLIC PANEL: eGFR: 63

## 2023-07-29 LAB — BASIC METABOLIC PANEL
BUN: 13 (ref 4–21)
Creatinine: 1.1 (ref 0.5–1.1)
Glucose: 60

## 2023-10-05 NOTE — Progress Notes (Deleted)
No chief complaint on file.   HISTORY OF PRESENT ILLNESS:  10/05/23 ALL:  Autumn Beasley is a 47 y.o. female here today for follow up for headaches. She was seen in consult with Dr Delena Bali 03/2023 describing a thunderclap headache. MRI brain and CTA head were unremarkable. She was advised to increase topiramate to 50mg  in am and 100mg  in pm. Sumatritpan continued for abortive therapy. Gabapentin and duloxetine continued per PCP.   Since,    HISTORY (copied from Dr Quentin Mulling previous note)  Medical co-morbidities: CREST syndrome, IBS, migraines   The patient presents for evaluation of headaches. 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 Headaches are occurring almost every day. Current headaches are described as sudden severe pain (feels like "getting hit with a hammer") in her left vertex and temple. This lasts for a few seconds at a time. She will typically have 2-3 of these in a row, which is then followed by a sensation of scalp tenderness and vice-like squeezing around her head. Squeezing headache can last for several hours at a time. Her most severe ones have been associated with nausea and dry heaving. Headaches have been waking her up from sleep at night. She has not been able to identify any triggers for her headaches.    She was prescribed sumatriptan by her PCP which did help relieve the vice-like headache after a few hours. Also tried Nurtec for rescue but did not find this effective.   Headache History: Onset: 3 weeks ago Aura: none Location: left temple/vertex Associated Symptoms:             Photophobia: no             Phonophobia: no             Nausea: yes Worse with activity?: yes Duration of headaches: seconds-1 minute   Headache days per month: 30 Headache free days per month: 0   Current Treatment: Abortive Imitrex    Preventative Topamax 100 mg QHS   Prior Therapies                                 Rescue: Imitrex Nurtec - lack of efficacy Tizanidine 4 mg PRN   Prevention: Cymbalta 30 mg QHS Gabapentin 300 mg QHS - fatigue Topamax 100 mg QHS   REVIEW OF SYSTEMS: Out of a complete 14 system review of symptoms, the patient complains only of the following symptoms, and all other reviewed systems are negative.   ALLERGIES: Allergies  Allergen Reactions   Morphine And Codeine Anaphylaxis   Penicillins Rash    Has patient had a PCN reaction causing immediate rash, facial/tongue/throat swelling, SOB or lightheadedness with hypotension: No Has patient had a PCN reaction causing severe rash involving mucus membranes or skin necrosis: No Has patient had a PCN reaction that required hospitalization No Has patient had a PCN reaction occurring within the last 10 years: No If all of the above answers are "NO", then may proceed with Cephalosporin use.      HOME MEDICATIONS: Outpatient Medications Prior to Visit  Medication Sig Dispense Refill   DULoxetine (CYMBALTA) 20 MG capsule Take 20 mg by mouth at bedtime.      gabapentin (NEURONTIN) 300 MG capsule Take 300 mg by mouth 3 (three) times daily.  hydroxychloroquine (PLAQUENIL) 200 MG tablet Take 200 mg by mouth daily.     Hyoscyamine Sulfate SL (LEVSIN/SL) 0.125 MG SUBL Place 0.125 mg under the tongue every 4 (four) hours as needed (abdominal pain). 30 tablet 0   Magnesium 500 MG CAPS Take by mouth at bedtime.     tiZANidine (ZANAFLEX) 4 MG tablet Take 4 mg by mouth every 6 (six) hours as needed for muscle spasms.     topiramate (TOPAMAX) 100 MG tablet Take 1/2 pill (50 mg) in the AM in 1 pill (100 mg) at bedtime 45 tablet 6   No facility-administered medications prior to visit.     PAST MEDICAL HISTORY: Past Medical History:  Diagnosis Date   Anal fissure    CREST syndrome (HCC)    rheumotology--- dr Dorathy Kinsman ([facility])  treated w/  plaquenil   Family history of adverse reaction to anesthesia    mother--- ponv   Gastropathy    GERD (gastroesophageal reflux disease)    Hemorrhoids    internal and external   Hemorrhoids    IBS (irritable bowel syndrome)    IBS (irritable bowel syndrome)    Lyme disease 2017   per pt dx 2017 w/ possible residual but unsure due to CREST syndrome   Migraine    PONV (postoperative nausea and vomiting)    severe     PAST SURGICAL HISTORY: Past Surgical History:  Procedure Laterality Date   DILATION AND CURETTAGE OF UTERUS  2003   EVALUATION UNDER ANESTHESIA WITH HEMORRHOIDECTOMY N/A 11/15/2020   Procedure: EXAM UNDER ANESTHESIA WITH HEMORRHOIDPEXY,;  Surgeon: Karie Soda, MD;  Location: [facility] [place];  Service: General;  Laterality: N/A;   LAPAROSCOPIC CHOLECYSTECTOMY  1997   SPHINCTEROTOMY N/A 11/15/2020   Procedure: SPHINCTEROTOMY;  Surgeon: Karie Soda, MD;  Location: [facility] [place];  Service: General;  Laterality: N/A;   TONSILLECTOMY  child   W/   BILATERAL TYMPANOPLASTY W/ TUBES   VAGINAL HYSTERECTOMY  2005   W/ UNILATERAL SALPINGOOPHORECTOMY     FAMILY HISTORY: Family History  Problem Relation Age of Onset   Crohn's disease Sister    Colon polyps Sister    Diabetes Maternal Aunt    Liver disease Maternal Aunt    COPD Maternal Aunt    Diabetes Maternal Grandmother    Alcohol abuse Maternal Grandfather    Colon cancer Neg Hx    Rectal cancer Neg Hx    Stomach cancer Neg Hx    Esophageal cancer Neg Hx    Pancreatic cancer Neg Hx      SOCIAL HISTORY: Social History   Socioeconomic History   Marital status: Married    Spouse name: Not on file   Number of children:  2   Years of education: Not on file   Highest education level: Not on file  Occupational History    Employer: [employer]  Tobacco Use   Smoking status: Never   Smokeless tobacco: Never  Vaping Use   Vaping status: Never Used  Substance and Sexual Activity   Alcohol  use: No   Drug use: Never   Sexual activity: Yes    Birth control/protection: Surgical    Comment: hyst  Other Topics Concern   Not on file  Social History Narrative   Not on file   Social Determinants of Health   Financial Resource Strain: Not on file  Food Insecurity: Not on file  Transportation Needs: Not on file  Physical Activity: Not on file  Stress:  Not on file  Social Connections: Not on file  Intimate Partner Violence: Not on file     PHYSICAL EXAM  There were no vitals filed for this visit. There is no height or weight on file to calculate BMI.  Generalized: Well developed, in no acute distress  Cardiology: normal rate and rhythm, no murmur auscultated  Respiratory: clear to auscultation bilaterally    Neurological examination  Mentation: Alert oriented to time, place, history taking. Follows all commands speech and language fluent Cranial nerve II-XII: Pupils were equal round reactive to light. Extraocular movements were full, visual field were full on confrontational test. Facial sensation and strength were normal. Uvula tongue midline. Head turning and shoulder shrug  were normal and symmetric. Motor: The motor testing reveals 5 over 5 strength of all 4 extremities. Good symmetric motor tone is noted throughout.  Sensory: Sensory testing is intact to soft touch on all 4 extremities. No evidence of extinction is noted.  Coordination: Cerebellar testing reveals good finger-nose-finger and heel-to-shin bilaterally.  Gait and station: Gait is normal. Tandem gait is normal. Romberg is negative. No drift is seen.  Reflexes: Deep tendon reflexes are symmetric and normal bilaterally.    DIAGNOSTIC DATA (LABS, IMAGING, TESTING) - I reviewed patient records, labs, notes, testing and imaging myself where available.  Lab Results  Component Value Date   WBC 5.0 03/10/2023   HGB 16.0 (H) 03/10/2023   HCT 47.9 (H) 03/10/2023   MCV 90.0 03/10/2023   PLT 253 03/10/2023       Component Value Date/Time   NA 138 03/10/2023 1431   K 4.0 03/10/2023 1431   CL 109 03/10/2023 1431   CO2 20 (L) 03/10/2023 1431   GLUCOSE 88 03/10/2023 1431   BUN 12 03/10/2023 1431   CREATININE 0.91 03/10/2023 1431   CALCIUM 8.8 (L) 03/10/2023 1431   PROT 7.2 10/22/2022 0955   ALBUMIN 4.3 10/22/2022 0955   AST 14 10/22/2022 0955   ALT 16 10/22/2022 0955   ALKPHOS 57 10/22/2022 0955   BILITOT 0.5 10/22/2022 0955   GFRNONAA >60 03/10/2023 1431   GFRAA >60 06/29/2020 1345   No results found for: "CHOL", "HDL", "LDLCALC", "LDLDIRECT", "TRIG", "CHOLHDL" Lab Results  Component Value Date   HGBA1C 5.0 12/13/2018   No results found for: "VITAMINB12" Lab Results  Component Value Date   TSH 1.27 10/22/2022        No data to display               No data to display           ASSESSMENT AND PLAN  47 y.o. year old female  has a past medical history of Anal fissure, CREST syndrome (HCC), Family history of adverse reaction to anesthesia, Gastropathy, GERD (gastroesophageal reflux disease), Hemorrhoids, Hemorrhoids, IBS (irritable bowel syndrome), IBS (irritable bowel syndrome), Lyme disease (2017), Migraine, and PONV (postoperative nausea and vomiting). here with    No diagnosis found.  Eddie [place] ***.  Healthy lifestyle habits encouraged. *** will follow up with PCP as directed. *** will return to see me in ***, sooner if needed. *** verbalizes understanding and agreement with this plan.   No orders of the defined types were placed in this encounter.    No orders of the defined types were placed in this encounter.    Shawnie Dapper, MSN, FNP-C 10/05/2023, 1:43 PM  [facility] [address] [place], [place] [address] [phone]

## 2023-10-06 ENCOUNTER — Telehealth: Payer: Self-pay | Admitting: Psychiatry

## 2023-10-06 ENCOUNTER — Ambulatory Visit: Payer: BC Managed Care – PPO | Admitting: Family Medicine

## 2023-10-06 DIAGNOSIS — G4453 Primary thunderclap headache: Secondary | ICD-10-CM

## 2023-10-06 NOTE — Telephone Encounter (Signed)
Pt LVM on 10/129/24 at 1:15pm to cancel appt due to have other health  issues need to have taken right niow.

## 2023-10-08 ENCOUNTER — Encounter (HOSPITAL_COMMUNITY): Payer: Self-pay

## 2023-10-08 ENCOUNTER — Emergency Department (HOSPITAL_COMMUNITY)
Admission: EM | Admit: 2023-10-08 | Discharge: 2023-10-08 | Disposition: A | Discharge status: Home / Self Care | Payer: BC Managed Care – PPO | Attending: Emergency Medicine | Admitting: Emergency Medicine

## 2023-10-08 ENCOUNTER — Other Ambulatory Visit: Payer: Self-pay

## 2023-10-08 DIAGNOSIS — R531 Weakness: Secondary | ICD-10-CM | POA: Diagnosis not present

## 2023-10-08 DIAGNOSIS — R5383 Other fatigue: Secondary | ICD-10-CM | POA: Diagnosis not present

## 2023-10-08 DIAGNOSIS — R519 Headache, unspecified: Secondary | ICD-10-CM | POA: Insufficient documentation

## 2023-10-08 DIAGNOSIS — R112 Nausea with vomiting, unspecified: Secondary | ICD-10-CM | POA: Diagnosis present

## 2023-10-08 LAB — CBC WITH DIFFERENTIAL/PLATELET
Abs Immature Granulocytes: 0.01 10*3/uL (ref 0.00–0.07)
Basophils Absolute: 0 10*3/uL (ref 0.0–0.1)
Basophils Relative: 1 %
Eosinophils Absolute: 0.1 10*3/uL (ref 0.0–0.5)
Eosinophils Relative: 2 %
HCT: 48.6 % — ABNORMAL HIGH (ref 36.0–46.0)
Hemoglobin: 16.5 g/dL — ABNORMAL HIGH (ref 12.0–15.0)
Immature Granulocytes: 0 %
Lymphocytes Relative: 19 %
Lymphs Abs: 1 10*3/uL (ref 0.7–4.0)
MCH: 30.2 pg (ref 26.0–34.0)
MCHC: 34 g/dL (ref 30.0–36.0)
MCV: 89 fL (ref 80.0–100.0)
Monocytes Absolute: 0.4 10*3/uL (ref 0.1–1.0)
Monocytes Relative: 8 %
Neutro Abs: 3.8 10*3/uL (ref 1.7–7.7)
Neutrophils Relative %: 70 %
Platelets: 224 10*3/uL (ref 150–400)
RBC: 5.46 MIL/uL — ABNORMAL HIGH (ref 3.87–5.11)
RDW: 13.8 % (ref 11.5–15.5)
WBC: 5.3 10*3/uL (ref 4.0–10.5)
nRBC: 0 % (ref 0.0–0.2)

## 2023-10-08 LAB — URINALYSIS, ROUTINE W REFLEX MICROSCOPIC
Bilirubin Urine: NEGATIVE
Glucose, UA: NEGATIVE mg/dL
Hgb urine dipstick: NEGATIVE
Ketones, ur: 5 mg/dL — AB
Leukocytes,Ua: NEGATIVE
Nitrite: NEGATIVE
Protein, ur: NEGATIVE mg/dL
Specific Gravity, Urine: 1.005 (ref 1.005–1.030)
pH: 7 (ref 5.0–8.0)

## 2023-10-08 LAB — COMPREHENSIVE METABOLIC PANEL
ALT: 22 U/L (ref 0–44)
AST: 19 U/L (ref 15–41)
Albumin: 4.2 g/dL (ref 3.5–5.0)
Alkaline Phosphatase: 59 U/L (ref 38–126)
Anion gap: 10 (ref 5–15)
BUN: 10 mg/dL (ref 6–20)
CO2: 23 mmol/L (ref 22–32)
Calcium: 9.4 mg/dL (ref 8.9–10.3)
Chloride: 104 mmol/L (ref 98–111)
Creatinine, Ser: 0.89 mg/dL (ref 0.44–1.00)
GFR, Estimated: >60 mL/min (ref >60)
Glucose, Bld: 96 mg/dL (ref 70–99)
Potassium: 3.7 mmol/L (ref 3.5–5.1)
Sodium: 137 mmol/L (ref 135–145)
Total Bilirubin: 0.7 mg/dL (ref 0.3–1.2)
Total Protein: 7.5 g/dL (ref 6.5–8.1)

## 2023-10-08 LAB — LIPASE, BLOOD: Lipase: 29 U/L (ref 11–51)

## 2023-10-08 LAB — CBG MONITORING, ED: Glucose-Capillary: 88 mg/dL (ref 70–99)

## 2023-10-08 MED ORDER — METOCLOPRAMIDE HCL 5 MG/ML IJ SOLN
10.0000 mg | Freq: Once | INTRAMUSCULAR | Status: AC
  Administered 2023-10-08: 10 mg via INTRAVENOUS
  Filled 2023-10-08: qty 2

## 2023-10-08 MED ORDER — DIPHENHYDRAMINE HCL 50 MG/ML IJ SOLN
25.0000 mg | Freq: Once | INTRAMUSCULAR | Status: AC
  Administered 2023-10-08: 25 mg via INTRAVENOUS
  Filled 2023-10-08: qty 1

## 2023-10-08 MED ORDER — SODIUM CHLORIDE 0.9 % IV BOLUS
1000.0000 mL | Freq: Once | INTRAVENOUS | Status: AC
  Administered 2023-10-08: 1000 mL via INTRAVENOUS

## 2023-10-08 MED ORDER — ONDANSETRON 4 MG PO TBDP: 4.0000 mg | ORAL_TABLET | Freq: Three times a day (TID) | ORAL | 0 refills | Status: AC | PRN

## 2023-10-08 MED ORDER — ONDANSETRON HCL 4 MG/2ML IJ SOLN
4.0000 mg | Freq: Once | INTRAMUSCULAR | Status: AC
  Administered 2023-10-08: 4 mg via INTRAVENOUS
  Filled 2023-10-08: qty 2

## 2023-10-08 NOTE — ED Provider Notes (Signed)
[facility] EMERGENCY DEPARTMENT AT [facility] Provider Note   CSN: [id number] Arrival date & time: 10/08/23  1020     History  Chief Complaint  Patient presents with   Fatigue    STEPHAINE Beasley is a 47 y.o. female.  Patient with history of cholecystectomy, presents to the emergency department for evaluation of fatigue, headache, vomiting.  Patient is currently using a Dexcom device to monitor her blood sugars.  She is not on any antihyperglycemic's.  She has had similar symptoms to today in the past, thought possibly related to episodes of hypoglycemia.  Her sugars have been down into the 40s at times.  She reports that her alarm woke her up several times overnight due to sugar being at 60.  She took glucose tablets and drink some Capri Sun.  She felt generally bad and fatigued.  2 episodes of nonbloody, nonbilious vomiting this morning.  No abdominal pain.  No diarrhea.  No known sick contacts.  Headache is generalized.  No fevers.       Home Medications Prior to Admission medications   Medication Sig Start Date End Date Taking? Authorizing Provider  DULoxetine (CYMBALTA) 20 MG capsule Take 20 mg by mouth at bedtime.     [provider]  gabapentin (NEURONTIN) 300 MG capsule Take 300 mg by mouth 3 (three) times daily.    [provider]  hydroxychloroquine (PLAQUENIL) 200 MG tablet Take 200 mg by mouth daily.    [provider]  Hyoscyamine Sulfate SL (LEVSIN/SL) 0.125 MG SUBL Place 0.125 mg under the tongue every 4 (four) hours as needed (abdominal pain). 06/29/20   Burgess Amor, PA-C  Magnesium 500 MG CAPS Take by mouth at bedtime.    [provider]  tiZANidine (ZANAFLEX) 4 MG tablet Take 4 mg by mouth every 6 (six) hours as needed for muscle spasms.    [provider]  topiramate (TOPAMAX) 100 MG tablet Take 1/2 pill (50 mg) in the AM in 1 pill (100 mg) at bedtime 03/19/23   Ocie Doyne, MD      Allergies    Morphine  and codeine and Penicillins    Review of Systems   Review of Systems  Physical Exam Updated Vital Signs BP 126/72 (BP Location: Left Arm)   Pulse 65   Temp 98.1 F (36.7 C) (Oral)   Resp 18   Ht 5\' 3"  (1.6 m)   Wt 102.1 kg   SpO2 99%   BMI 39.86 kg/m  Physical Exam Vitals and nursing note reviewed.  Constitutional:      General: She is not in acute distress.    Appearance: She is well-developed.  HENT:     Head: Normocephalic and atraumatic.     Right Ear: External ear normal.     Left Ear: External ear normal.     Nose: Nose normal.  Eyes:     Conjunctiva/sclera: Conjunctivae normal.  Cardiovascular:     Rate and Rhythm: Normal rate and regular rhythm.     Heart sounds: No murmur heard. Pulmonary:     Effort: No respiratory distress.     Breath sounds: No wheezing, rhonchi or rales.  Abdominal:     Palpations: Abdomen is soft.     Tenderness: There is no abdominal tenderness. There is no guarding or rebound.  Musculoskeletal:     Cervical back: Normal range of motion and neck supple.     Right lower leg: No edema.  Left lower leg: No edema.  Skin:    General: Skin is warm and dry.     Findings: No rash.  Neurological:     General: No focal deficit present.     Mental Status: She is alert. Mental status is at baseline.     Motor: No weakness.  Psychiatric:        Mood and Affect: Mood normal.     ED Results / Procedures / Treatments   Labs (all labs ordered are listed, but only abnormal results are displayed) Labs Reviewed  CBC WITH DIFFERENTIAL/PLATELET - Abnormal; Notable for the following components:      Result Value   RBC 5.46 (*)    Hemoglobin 16.5 (*)    HCT 48.6 (*)    All other components within normal limits  URINALYSIS, ROUTINE W REFLEX MICROSCOPIC - Abnormal; Notable for the following components:   Color, Urine STRAW (*)    Ketones, ur 5 (*)    All other components within normal limits  COMPREHENSIVE METABOLIC PANEL  LIPASE, BLOOD   CBG MONITORING, ED    EKG None  Radiology No results found.  Procedures Procedures    Medications Ordered in ED Medications  sodium chloride 0.9 % bolus 1,000 mL (0 mLs Intravenous Stopped 10/08/23 1431)  metoCLOPramide (REGLAN) injection 10 mg (10 mg Intravenous Given 10/08/23 1139)  diphenhydrAMINE (BENADRYL) injection 25 mg (25 mg Intravenous Given 10/08/23 1135)  ondansetron (ZOFRAN) injection 4 mg (4 mg Intravenous Given 10/08/23 1243)    ED Course/ Medical Decision Making/ A&P    Patient seen and examined. History obtained directly from patient.   Labs/EKG: Ordered CBC, CMP, lipase, UA.  Imaging: none ordered.  Medications/Fluids: Ordered: Reglan, Benadryl, fluid bolus.   Most recent vital signs reviewed and are as follows: BP 126/72 (BP Location: Left Arm)   Pulse 65   Temp 98.1 F (36.7 C) (Oral)   Resp 18   Ht 5\' 3"  (1.6 m)   Wt 102.1 kg   SpO2 99%   BMI 39.86 kg/m   Initial impression: Vomiting    Reassessment performed. Patient appears improved.  Per RN, she did have increased nausea and required an additional dose of antiemetics and was given Zofran.  Currently she states that her nausea is resolved as well as her headache.  She is feeling improved.  Labs personally reviewed and interpreted including: CBC showing elevated hemoglobin which is consistent with previous CBCs.  CMP and lipase are unremarkable with normal kidney function and normal blood sugar.  Patient was eventually able to urinate and UA did not show any signs of infection.  Reviewed pertinent lab work and imaging with patient at bedside. Questions answered.   Most current vital signs reviewed and are as follows: BP 109/77   Pulse 65   Temp 98 F (36.7 C) (Oral)   Resp 17   Ht 5\' 3"  (1.6 m)   Wt 102.1 kg   SpO2 99%   BMI 39.86 kg/m   Plan: Discharge to home.   Prescriptions written for: Zofran  Other home care instructions discussed: Plan diet, maintain good  hydration.  ED return instructions discussed: Return with new or worsening symptoms, persistent vomiting  Follow-up instructions discussed: Patient encouraged to follow-up with their PCP in 3 days.                                    Medical Decision  Making Amount and/or Complexity of Data Reviewed Labs: ordered.  Risk Prescription drug management.   For this patient's complaint of N/V, the following conditions were considered on the differential diagnosis: gastritis/PUD, enteritis/duodenitis, appendicitis, cholelithiasis/cholecystitis, cholangitis, pancreatitis, ruptured viscus, colitis, diverticulitis, small/large bowel obstruction, proctitis, cystitis, pyelonephritis, ureteral colic, aortic dissection, aortic aneurysm. In women, ectopic pregnancy, pelvic inflammatory disease, ovarian cysts, and tubo-ovarian abscess were also considered. Atypical chest etiologies were also considered including ACS, PE, and pneumonia.  In regards to the patient's headache, critical differentials were considered including subarachnoid hemorrhage, intracerebral hemorrhage, epidural/subdural hematoma, pituitary apoplexy, vertebral/carotid artery dissection, giant cell arteritis, central venous thrombosis, reversible cerebral vasoconstriction, acute angle closure glaucoma, idiopathic intracranial hypertension, bacterial meningitis, viral encephalitis, carbon monoxide poisoning, posterior reversible encephalopathy syndrome, pre-eclampsia.   Reg flag symptoms related to these causes were considered including systemic symptoms (fever, weight loss), neurologic symptoms (confusion, mental status change, vision change, associated seizure), acute or sudden "thunderclap" onset, patient age 54 or older with new or progressive headache, patient of any age with first headache or change in headache pattern, pregnant or postpartum status, history of HIV or other immunocompromise, history of cancer, headache occurring with  exertion, associated neck or shoulder pain, associated traumatic injury, concurrent use of anticoagulation, family history of spontaneous SAH, and concurrent drug use.    Other benign, more common causes of headache were considered including migraine, tension-type headache, cluster headache, referred pain from other cause such as sinus infection, dental pain, trigeminal neuralgia.   On exam, patient has a reassuring neuro exam including baseline mental status, no significant neck pain or meningeal signs, no signs of severe infection or fever.   The patient's vital signs, pertinent lab work and imaging were reviewed and interpreted as discussed in the ED course. Hospitalization was considered for further testing, treatments, or serial exams/observation. However as patient is well-appearing, has a stable exam over the course of their evaluation, and reassuring studies today, I do not feel that they warrant admission at this time. This plan was discussed with the patient who verbalizes agreement and comfort with this plan and seems reliable and able to return to the Emergency Department with worsening or changing symptoms.           Final Clinical Impression(s) / ED Diagnoses Final diagnoses:  Nausea and vomiting, unspecified vomiting type  Acute nonintractable headache, unspecified headache type  Generalized weakness    Rx / DC Orders ED Discharge Orders          Ordered    ondansetron (ZOFRAN-ODT) 4 MG disintegrating tablet  Every 8 hours PRN        10/08/23 1713              Renne Crigler, PA-C 10/08/23 1714    Lonell Grandchild, MD 10/09/23 [phone]

## 2023-10-08 NOTE — ED Notes (Signed)
Pt reports feeling nausea after drinking ginger ale. Not tolerated well.

## 2023-10-08 NOTE — ED Notes (Signed)
Pt given ginger ale to see if tolerated

## 2023-10-08 NOTE — ED Triage Notes (Signed)
Pt comes in with complaints of low blood sugar this morning. Pt states she is awaiting an appointment with an Endocrinologist but is not able to get in until January. Pt states PCP has prescribed her a dexcon d/t pt having issues with her sugar dropping in the mornings. Pt states this morning having, n/v, and generalized headache.

## 2023-10-08 NOTE — Discharge Instructions (Addendum)
Please read and follow all provided instructions.  Your diagnoses today include:  1. Nausea and vomiting, unspecified vomiting type   2. Acute nonintractable headache, unspecified headache type   3. Generalized weakness     Tests performed today include: Complete blood cell count: Your hemoglobin was in the elevated, consistent with previous tests, normal white blood cell count Complete metabolic panel: No concerns Lipase (pancreas function test): Was normal Urinalysis (urine test): No signs of infection Vital signs. See below for your results today.   Medications prescribed:  Zofran (ondansetron) - for nausea and vomiting  Take any prescribed medications only as directed.  Home care instructions:  Follow any educational materials contained in this packet.  BE VERY CAREFUL not to take multiple medicines containing Tylenol (also called acetaminophen). Doing so can lead to an overdose which can damage your liver and cause liver failure and possibly death.   Follow-up instructions: Please follow-up with your primary care provider in the next 3 days for further evaluation of your symptoms.   Return instructions:  Please return to the Emergency Department if you experience worsening symptoms.  Please return if you have any other emergent concerns.  Additional Information:  Your vital signs today were: BP 106/67   Pulse 65   Temp 98.1 F (36.7 C) (Oral)   Resp 14   Ht 5\' 3"  (1.6 m)   Wt 102.1 kg   SpO2 99%   BMI 39.86 kg/m  If your blood pressure (BP) was elevated above 135/85 this visit, please have this repeated by your doctor within one month. --------------

## 2023-10-12 ENCOUNTER — Telehealth: Payer: Self-pay | Admitting: Nurse Practitioner

## 2023-10-12 NOTE — Telephone Encounter (Signed)
Can you call her and relay this to her?

## 2023-10-12 NOTE — Telephone Encounter (Signed)
No, we are overbooked as is.  She can be added to a cancellation list.  I did review her chart and I dont think it is an urgent referral.  I would recommend she keep a detailed diary of her foods/drinks and continue to watch glucose levels with the CGM.

## 2023-10-12 NOTE — Telephone Encounter (Signed)
Pt states she has been in ER and PCP states for her to call everday to see if we've had a cancellation.  Do you want me to overbook?

## 2023-10-13 NOTE — Telephone Encounter (Signed)
Notified pt that she is on the cancellation list. Pt states she will still call here everyday.

## 2023-10-18 NOTE — Patient Instructions (Signed)
Hypoglycemia Hypoglycemia is when the amount of sugar, or glucose, in your blood is too low. Low blood sugar can happen if you have diabetes or if you don't have diabetes. It may be an emergency. What are the causes? Low blood sugar happens most often in people who have diabetes. It may be caused by: Diabetes medicine. Not eating enough, or not eating often enough. Being more active than normal. If you don't have diabetes, you may still get low blood sugar if: There's a tumor in your pancreas. A tumor is a growth of cells that isn't normal. You don't eat enough, or you fast. Fasting is when you don't eat for long periods at a time. You have a bad infection or illness. You have problems after weight loss surgery. You have kidney or liver problems. You take certain medicines. What increases the risk? You're more likely to have low blood sugar if: You have diabetes and take medicine for it. You drink a lot of alcohol. You get sick. What are the signs or symptoms? Mild Hunger or feeling like you may vomit. Sweating and feeling cold to the touch. Feeling dizzy or light-headed. Being sleepy or having trouble sleeping. A headache. Blurry vision. Mood changes. These include feeling worried, nervous, or easily annoyed. Moderate Feeling confused. Changes in the way you act. Weakness. An uneven heartbeat. Very bad Having very low blood sugar is an emergency. It can cause: Fainting. Seizures. A coma. Death. How is this diagnosed?  Low blood sugar can be found with a blood test. This test tells you how much sugar is in your blood. It's done while you're having symptoms. Your health care provider may also do an exam and look at your medical history. How is this treated? Treating low blood sugar If you have low blood sugar, eat or drink something with sugar in it right away. The food or drink should have 15 grams of a fast-acting carbohydrate (carb). Options include: 4 oz (120 mL) of  fruit juice. 4 oz (120 mL) of soda (not diet soda). A few pieces of hard candy. Check food labels to see how many pieces to eat. 1 Tbsp (15 mL) of sugar or honey. 4 glucose tablets. 1 tube of glucose gel. Treating low blood sugar if you have diabetes Talk with your provider about how much carb you should take. If you're alert and can swallow safely, you may follow the 15:15 rule: Take 15 grams of a fast-acting carb. Check your blood sugar 15 minutes after you take the carb. If your blood sugar is still at or below 70 mg/dL (3.9 mmol/L), take 15 grams of a carb again. If your blood sugar doesn't go above 70 mg/dL (3.9 mmol/L) after 3 tries, get help right away. After your blood sugar goes back to normal, eat a meal or a snack within 1 hour. Always keep 15 grams of a fast-acting carb with you. This could be: 4 glucose tablets. A few pieces of hard candy. 1 Tbsp (15 mL) of honey or sugar. 1 tube of glucose gel. Treating very low blood sugar If your blood sugar is less than 54 mg/dL (3 mmol/L), it's an emergency. Get help right away. If you can't eat or drink, you will need to be given glucagon. A family member or friend should learn how to check your blood sugar and give you glucagon. Ask your provider if you should keep a glucagon kit at home. You may also need to be treated in a hospital. Follow  these instructions at home: If you have diabetes: Always keep a fast-acting carb (15 grams) with you. Follow your diabetes care plan. Make sure you: Know the symptoms of low blood sugar. Check your blood sugar as often as told. Always check it before and after you exercise. Always check your blood sugar before you drive. Take your medicines as told. Eat on time. Do not skip meals. Share your diabetes care plan with: Your work or school. The people you live with. Wear an alert bracelet or carry a card that says you have diabetes. General instructions If you drink alcohol: Limit how much you  have to: 0-1 drink a day if you're female. 0-2 drinks a day if you're female. Know how much alcohol is in your drink. In the [place] one drink is one 12 oz bottle of beer (355 mL), one 5 oz glass of wine (148 mL), or one 1 oz glass of hard liquor (44 mL). Be sure to eat food when you drink alcohol. Be sure to check your blood sugar after you drink. Alcohol may lead to low blood sugar later. Where to find more information American Diabetes Association (ADA): diabetes.org Contact a health care provider if: You have low blood sugar often. You have diabetes and are having trouble keeping your blood sugar in the right range. Get help right away if: You can't get your blood sugar above 70 mg/dL (3.9 mmol/L) after 3 tries. Your blood sugar is below 54 mg/dL (3 mmol/L). You have a seizure. You faint. These symptoms may be an emergency. Call 911 right away. Do not wait to see if the symptoms will go away. Do not drive yourself to the hospital. This information is not intended to replace advice given to you by your health care provider. Make sure you discuss any questions you have with your health care provider. Document Revised: 02/10/2023 Document Reviewed: 02/10/2023 Elsevier Patient Education  2024 [employer].

## 2023-10-19 ENCOUNTER — Encounter: Payer: Self-pay | Admitting: Nurse Practitioner

## 2023-10-19 ENCOUNTER — Ambulatory Visit (INDEPENDENT_AMBULATORY_CARE_PROVIDER_SITE_OTHER): Payer: BC Managed Care – PPO | Admitting: Nurse Practitioner

## 2023-10-19 VITALS — BP 132/82 | HR 88 | Ht 63.0 in | Wt 227.8 lb

## 2023-10-19 DIAGNOSIS — Z8349 Family history of other endocrine, nutritional and metabolic diseases: Secondary | ICD-10-CM | POA: Diagnosis not present

## 2023-10-19 DIAGNOSIS — E559 Vitamin D deficiency, unspecified: Secondary | ICD-10-CM | POA: Diagnosis not present

## 2023-10-19 DIAGNOSIS — E162 Hypoglycemia, unspecified: Secondary | ICD-10-CM

## 2023-10-19 LAB — POCT GLYCOSYLATED HEMOGLOBIN (HGB A1C): Hemoglobin A1C: 5.1 % (ref 4.0–5.6)

## 2023-10-19 MED ORDER — GABAPENTIN 100 MG PO CAPS: 100.0000 mg | ORAL_CAPSULE | Freq: Every day | ORAL | 1 refills | Status: AC

## 2023-10-19 NOTE — Progress Notes (Signed)
Endocrinology Consult Note                                            10/19/2023, 3:57 PM   Subjective:    Patient ID: Autumn Beasley, female    DOB: May 31, 1976, PCP Ignatius Specking, MD   Past Medical History:  Diagnosis Date   Anal fissure    CREST syndrome (HCC)    rheumotology--- dr Dorathy Kinsman ([facility])  treated w/ plaquenil   Family history of adverse reaction to anesthesia    mother--- ponv   Gastropathy    GERD (gastroesophageal reflux disease)    Hemorrhoids    internal and external   Hemorrhoids    IBS (irritable bowel syndrome)    IBS (irritable bowel syndrome)    Lyme disease 2017   per pt dx 2017 w/ possible residual but unsure due to CREST syndrome   Migraine    PONV (postoperative nausea and vomiting)    severe   Past Surgical History:  Procedure Laterality Date   DILATION AND CURETTAGE OF UTERUS  2003   EVALUATION UNDER ANESTHESIA WITH HEMORRHOIDECTOMY N/A 11/15/2020   Procedure: EXAM UNDER ANESTHESIA WITH HEMORRHOIDPEXY,;  Surgeon: Karie Soda, MD;  Location: [facility];  Service: General;  Laterality: N/A;   LAPAROSCOPIC CHOLECYSTECTOMY  1997   SPHINCTEROTOMY N/A 11/15/2020   Procedure: SPHINCTEROTOMY;  Surgeon: Karie Soda, MD;  Location: [facility];  Service: General;  Laterality: N/A;   TONSILLECTOMY  child   W/   BILATERAL TYMPANOPLASTY W/ TUBES   VAGINAL HYSTERECTOMY  2005   W/ UNILATERAL SALPINGOOPHORECTOMY   Social History   Socioeconomic History   Marital status: Married    Spouse name: Not on file   Number of children:  2   Years of education: Not on file   Highest education level: Not on file  Occupational History    Employer: [employer]  Tobacco Use   Smoking status: Never   Smokeless tobacco: Never  Vaping Use   Vaping status: Never Used  Substance and Sexual Activity   Alcohol use: No   Drug use: Never   Sexual activity: Yes    Birth control/protection: Surgical    Comment: hyst  Other  Topics Concern   Not on file  Social History Narrative   Not on file   Social Determinants of Health   Financial Resource Strain: Not on file  Food Insecurity: Not on file  Transportation Needs: Not on file  Physical Activity: Not on file  Stress: Not on file  Social Connections: Not on file   Family History  Problem Relation Age of Onset   Crohn's disease Sister    Colon polyps Sister    Diabetes Maternal Aunt    Liver disease Maternal Aunt    COPD Maternal Aunt    Diabetes Maternal Grandmother    Alcohol abuse Maternal Grandfather    Colon cancer Neg Hx    Rectal cancer Neg Hx    Stomach cancer Neg Hx    Esophageal cancer Neg Hx    Pancreatic cancer Neg Hx    Outpatient Encounter Medications as of 10/19/2023  Medication Sig   cetirizine (ZYRTEC) 10 MG tablet Take 10 mg by mouth as needed for allergies.   DULoxetine (CYMBALTA) 20 MG capsule Take 30 mg by mouth at bedtime.  gabapentin (NEURONTIN) 100 MG capsule Take 1 capsule (100 mg total) by mouth at bedtime.   Hyoscyamine Sulfate SL (LEVSIN/SL) 0.125 MG SUBL Place 0.125 mg under the tongue every 4 (four) hours as needed (abdominal pain).   Magnesium 500 MG CAPS Take by mouth at bedtime.   ondansetron (ZOFRAN-ODT) 4 MG disintegrating tablet Take 1 tablet (4 mg total) by mouth every 8 (eight) hours as needed for nausea or vomiting.   tiZANidine (ZANAFLEX) 4 MG tablet Take 4 mg by mouth every 6 (six) hours as needed for muscle spasms.   topiramate (TOPAMAX) 100 MG tablet Take 1/2 pill (50 mg) in the AM in 1 pill (100 mg) at bedtime   [DISCONTINUED] gabapentin (NEURONTIN) 300 MG capsule Take by mouth at bedtime.   [DISCONTINUED] hydroxychloroquine (PLAQUENIL) 200 MG tablet Take 200 mg by mouth daily. (Patient not taking: Reported on 10/19/2023)   No facility-administered encounter medications on file as of 10/19/2023.   ALLERGIES: Allergies  Allergen Reactions   Morphine And Codeine Anaphylaxis   Penicillins Rash     Has patient had a PCN reaction causing immediate rash, facial/tongue/throat swelling, SOB or lightheadedness with hypotension: No Has patient had a PCN reaction causing severe rash involving mucus membranes or skin necrosis: No Has patient had a PCN reaction that required hospitalization No Has patient had a PCN reaction occurring within the last 10 years: No If all of the above answers are "NO", then may proceed with Cephalosporin use.     VACCINATION STATUS:  There is no immunization history on file for this patient.  Hypoglycemia This is a recurrent problem. Episode onset: about a month ago. The problem occurs every several days. The problem has been waxing and waning. Associated symptoms include headaches, nausea, vomiting and weakness. The symptoms are aggravated by eating and drinking. She has tried eating and drinking for the symptoms. The treatment provided mild relief.   Autumn Beasley is 47 y.o. female who presents today (accompanied by her mother- as she has been afraid to drive with these recent drops in glucose) with a medical history as above. she is being seen in consultation for hypoglycemia requested by Ignatius Specking, MD.  she has been dealing with symptoms of glucose dropping randomly to the point where she has significant symptoms, irritability, vomiting, excessive thirst, fatigue, and rapid weight gain.  This has been going on for about a month where she had GI problems with nausea/vomiting.  Since then she has had a hard time eating any solid foods, says the thought of eating makes her nauseous.  She notes her glucose on several different blood draws were 60-62 and this was not fasting labs.  She has been wearing a CGM device and has noted drops in glucose into the 40s at times.  Analysis of her CGM shows TIR 95%, TAR 0%, TBR 4% with GMI of 5.4%.  She has been relying on protein shakes to give her the nutrition she needs.  She has been drinking water, and zero sugar drinks like  tea, lemonade, and zero sugar sodas.  She does not exercise optimally as she has no energy.  She does have family history of diabetes, sees rheumatology for joint pain.  She also follows with GI for GERD and esophageal stricture requiring dilation every so often.   Review of systems  Constitutional: + rapidly increasing body weight,  current Body mass index is 40.35 kg/m. , + fatigue, no subjective hyperthermia, no subjective hypothermia, + increased thirst Eyes:  no blurry vision, no xerophthalmia ENT: no sore throat, no nodules palpated in throat, + intermittent dysphagia/odynophagia, no hoarseness Cardiovascular: no chest pain, no shortness of breath, no palpitations, no leg swelling Respiratory: no cough, no shortness of breath Gastrointestinal: + intermittent nausea/vomiting/diarrhea Musculoskeletal: + diffuse muscle/joint aches- sees rheumatology Skin: no rashes, no hyperemia Neurological: + tremors, no numbness, no tingling, no dizziness Psychiatric: no depression, no anxiety, + irritability  Objective:       10/19/2023    1:07 PM 10/08/2023    3:30 PM 10/08/2023    3:00 PM  Vitals with BMI  Height 5\' 3"     Weight 227 lbs 13 oz    BMI 40.36    Systolic 132 109 409  Diastolic 82 77 70  Pulse 88      BP 132/82 (BP Location: Left Arm, Patient Position: Sitting, Cuff Size: Large)   Pulse 88   Ht 5\' 3"  (1.6 m)   Wt 227 lb 12.8 oz (103.3 kg)   BMI 40.35 kg/m   Wt Readings from Last 3 Encounters:  10/19/23 227 lb 12.8 oz (103.3 kg)  10/08/23 225 lb (102.1 kg)  03/19/23 221 lb (100.2 kg)      Physical Exam- Limited  Constitutional:  Body mass index is 40.35 kg/m. , not in acute distress, normal state of mind, did get tearful when explaining her recent health concerns Eyes:  EOMI, no exophthalmos Neck: Supple Thyroid: No gross goiter Cardiovascular: RRR, no murmurs, rubs, or gallops, no edema Respiratory: Adequate breathing efforts, no crackles, rales, rhonchi, or  wheezing Musculoskeletal: no gross deformities, strength intact in all four extremities, no gross restriction of joint movements Skin:  no rashes, no hyperemia Neurological: slight tremor with outstretched hands R>L  CMP ( most recent) CMP     Component Value Date/Time   NA 137 10/08/2023 1127   K 3.7 10/08/2023 1127   CL 104 10/08/2023 1127   CO2 23 10/08/2023 1127   GLUCOSE 96 10/08/2023 1127   BUN 10 10/08/2023 1127   BUN 13 07/29/2023 0000   CREATININE 0.89 10/08/2023 1127   CALCIUM 9.4 10/08/2023 1127   PROT 7.5 10/08/2023 1127   ALBUMIN 4.2 10/08/2023 1127   AST 19 10/08/2023 1127   ALT 22 10/08/2023 1127   ALKPHOS 59 10/08/2023 1127   BILITOT 0.7 10/08/2023 1127   GFR 70.62 10/22/2022 0955   EGFR 63 07/29/2023 0000   GFRNONAA >60 10/08/2023 1127     Diabetic Labs (most recent): Lab Results  Component Value Date   HGBA1C 5.1 10/19/2023   HGBA1C 5.0 12/13/2018     Lipid Panel ( most recent) Lipid Panel  No results found for: "CHOL", "TRIG", "HDL", "CHOLHDL", "VLDL", "LDLCALC", "LDLDIRECT", "LABVLDL"    Lab Results  Component Value Date   TSH 1.27 10/22/2022           Assessment & Plan:   1. Hypoglycemia  - SIDRAH DESHPANDE  is being seen at a kind request of Vyas, Dhruv B, MD. - I have reviewed her available records and clinically evaluated the patient. - Based on these reviews, she has reactive hypoglycemia, however,  there is not sufficient information to proceed with definitive treatment plan.  - she will need a repeat, more complete blood work towards confirming the diagnosis.  I will check insulin and cpeptide levels, CMP, magnesium, and comprehensive thyroid labs to check for underlying medical conditions which may be contributing to her recent symptoms.  I encouraged her to continue using  her Dexcom CGM to monitor glucose at home and focus on improving her diet and cutting out all processed foods.  We talked about importance of nutrition and  high quality foods.   The following Lifestyle Medicine recommendations according to American College of Lifestyle Medicine [facility]) were discussed and offered to patient and she agrees to start the journey:  A. Whole Foods, Plant-based plate comprising of fruits and vegetables, plant-based proteins, whole-grain carbohydrates was discussed in detail with the patient.   A list for source of those nutrients were also provided to the patient.  Patient will use only water or unsweetened tea for hydration. B.  The need to stay away from risky substances including alcohol, smoking; obtaining 7 to 9 hours of restorative sleep, at least 150 minutes of moderate intensity exercise weekly, the importance of healthy social connections,  and stress reduction techniques were discussed. C.  A full color page of  Calorie density of various food groups per pound showing examples of each food groups was provided to the patient.    -She will not need any imaging studies at this time.  - I did not initiate any new prescriptions today.  - she is advised to maintain close follow up with Ignatius Specking, MD for primary care needs.   -Thank you for involving me in the care of this pleasant patient.  Time spent with the patient: 60  minutes, of which >50% was spent in  counseling her about her hypoglycemia and the rest in obtaining information about her symptoms, reviewing her previous labs/studies ( including abstractions from other facilities),  evaluations, and treatments,  and developing a plan to confirm diagnosis and long term treatment based on the latest standards of care/guidelines; and documenting her care.  Autumn Beasley participated in the discussions, expressed understanding, and voiced agreement with the above plans.  All questions were answered to her satisfaction. she is encouraged to contact clinic should she have any questions or concerns prior to her return visit.  Follow up plan: Return will call with  results.   Ronny Bacon, [facility] [address] [place], [place] [address] Phone: [phone] Fax: [phone]   10/19/2023, 3:57 PM

## 2023-10-21 ENCOUNTER — Encounter: Payer: Self-pay | Admitting: Nurse Practitioner

## 2023-10-21 LAB — THYROID PEROXIDASE ANTIBODY: Thyroperoxidase Ab SerPl-aCnc: 11 [IU]/mL (ref 0–34)

## 2023-10-21 LAB — COMPREHENSIVE METABOLIC PANEL
ALT: 16 [IU]/L (ref 0–32)
AST: 20 [IU]/L (ref 0–40)
Albumin: 4.5 g/dL (ref 3.9–4.9)
Alkaline Phosphatase: 70 [IU]/L (ref 44–121)
BUN/Creatinine Ratio: 17 (ref 9–23)
BUN: 18 mg/dL (ref 6–24)
Bilirubin Total: 0.5 mg/dL (ref 0.0–1.2)
CO2: 18 mmol/L — ABNORMAL LOW (ref 20–29)
Calcium: 9.8 mg/dL (ref 8.7–10.2)
Chloride: 104 mmol/L (ref 96–106)
Creatinine, Ser: 1.06 mg/dL — ABNORMAL HIGH (ref 0.57–1.00)
Globulin, Total: 2.4 g/dL (ref 1.5–4.5)
Glucose: 82 mg/dL (ref 70–99)
Potassium: 4.5 mmol/L (ref 3.5–5.2)
Sodium: 141 mmol/L (ref 134–144)
Total Protein: 6.9 g/dL (ref 6.0–8.5)
eGFR: 66 mL/min/{1.73_m2} (ref >59)

## 2023-10-21 LAB — T3, FREE: T3, Free: 2.9 pg/mL (ref 2.0–4.4)

## 2023-10-21 LAB — TSH: TSH: 1.55 u[IU]/mL (ref 0.450–4.500)

## 2023-10-21 LAB — VITAMIN D 25 HYDROXY (VIT D DEFICIENCY, FRACTURES): Vit D, 25-Hydroxy: 32.7 ng/mL (ref 30.0–100.0)

## 2023-10-21 LAB — INSULIN AND C-PEPTIDE, SERUM
C-Peptide: 4.9 ng/mL — ABNORMAL HIGH (ref 1.1–4.4)
INSULIN: 16.7 u[IU]/mL (ref 2.6–24.9)

## 2023-10-21 LAB — T4, FREE: Free T4: 1.13 ng/dL (ref 0.82–1.77)

## 2023-10-21 LAB — THYROGLOBULIN ANTIBODY: Thyroglobulin Antibody: <1 [IU]/mL (ref 0.0–0.9)

## 2023-10-21 LAB — MAGNESIUM: Magnesium: 2.4 mg/dL — ABNORMAL HIGH (ref 1.6–2.3)

## 2023-10-21 NOTE — Progress Notes (Signed)
Shoot I forgot to say when in the previous note, schedule in 3 months.

## 2023-10-21 NOTE — Telephone Encounter (Signed)
See message.

## 2023-10-21 NOTE — Progress Notes (Signed)
Need to get her scheduled for follow up in office for hypoglycemia (no labs needed)

## 2023-12-14 ENCOUNTER — Ambulatory Visit: Payer: BC Managed Care – PPO | Admitting: Nurse Practitioner

## 2024-01-11 ENCOUNTER — Telehealth: Payer: Self-pay | Admitting: Nurse Practitioner

## 2024-01-11 NOTE — Telephone Encounter (Signed)
 noted

## 2024-01-11 NOTE — Telephone Encounter (Signed)
Pt states she is doing so much better with her sugar. It is no longer dropping. She has canceled future appts

## 2024-01-21 ENCOUNTER — Ambulatory Visit: Payer: BC Managed Care – PPO | Admitting: Nurse Practitioner

## 2024-02-15 ENCOUNTER — Ambulatory Visit (INDEPENDENT_AMBULATORY_CARE_PROVIDER_SITE_OTHER): Admitting: Neurology

## 2024-02-15 ENCOUNTER — Encounter: Payer: Self-pay | Admitting: Neurology

## 2024-02-15 ENCOUNTER — Telehealth: Payer: Self-pay | Admitting: Psychiatry

## 2024-02-15 VITALS — BP 132/77 | HR 81 | Ht 63.0 in | Wt 228.0 lb

## 2024-02-15 DIAGNOSIS — M7552 Bursitis of left shoulder: Secondary | ICD-10-CM

## 2024-02-15 DIAGNOSIS — G43909 Migraine, unspecified, not intractable, without status migrainosus: Secondary | ICD-10-CM

## 2024-02-15 DIAGNOSIS — G4453 Primary thunderclap headache: Secondary | ICD-10-CM

## 2024-02-15 DIAGNOSIS — R519 Headache, unspecified: Secondary | ICD-10-CM

## 2024-02-15 MED ORDER — INDOMETHACIN 25 MG PO CAPS: ORAL_CAPSULE | ORAL | 1 refills | Status: AC

## 2024-02-15 MED ORDER — SUMATRIPTAN SUCCINATE 100 MG PO TABS
100.0000 mg | ORAL_TABLET | Freq: Once | ORAL | 2 refills | Status: AC | PRN
Start: 2024-02-15 — End: ?

## 2024-02-15 MED ORDER — KETOROLAC TROMETHAMINE 60 MG/2ML IM SOLN
60.0000 mg | Freq: Once | INTRAMUSCULAR | Status: AC
  Administered 2024-02-15: 60 mg via INTRAMUSCULAR

## 2024-02-15 NOTE — Telephone Encounter (Signed)
 Spoke to patient per dr sater offer today appointment at 1130am Pt accepted appointment . Pt thanked me for calling.

## 2024-02-15 NOTE — Telephone Encounter (Signed)
 Pt called stating that yesterday she spent her day in the ER due to vomiting and a really bad headache. She states that this is her 4th time having to go to the ER. Pt would like to speak to the RN to be advised on what she can do to keep it under control.

## 2024-02-15 NOTE — Progress Notes (Signed)
 [facility]  PATIENT: Autumn Beasley DOB: 07-Nov-1976  REFERRING DOCTOR OR PCP:  Dr Sherril Croon (PCP) SOURCE: Patient, notes from Dr. Delena Bali, notes from emergency room, imaging and lab reports, MRI images personally reviewed.  _________________________________   HISTORICAL  CHIEF COMPLAINT:  Chief Complaint  Patient presents with   Follow-up    Pt in 11 with husband Pt sattes increased headaches . Pt states 2 headaches in last month  Pt states soreness on right side of head and neck Pt states 2 ED visit due to headaches     HISTORY OF PRESENT ILLNESS:  Autumn Beasley is a 48 year old woman with headaches.  Update 02/15/2024 She had seen Dr. Delena Bali last year and was diagnosed with thunderclap headaches.  She currently has headache that started yesterday with a pressure sensation from the occiput to the temples bilaterally.  She has had a few similar headaches.  The first started in October when she woke up.  She went to UC and received Zofran as she also had nausea.   HA improved. Two more severe headaches occurred in November and February with severe head pain and N/V.   She went to the ED and received Zofran.    Her last HA started yesterday with the severe pain, and more N/V than previous HA.   She is better than yesterday.  She went to the emergency room at [facility]  Lab work was noncontributory.  CT angiogram of the head and neck was noncontributory.  In the emergency room, she received Zofran, fentanyl 25 mcg, Benadryl and Decadron.  She also was given an Esgic tablet.  Pain improved and she was discharged.  She was given a prescription for Fioricet and Zofran.  She has a long headache history.  This is detailed in the note copied below.  She used to have frequent migraines (2-4 a month) and TPM has helped.  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  Otherwise was normal for age.     From Dr. Delena Bali [facility]  co-morbidities: CREST syndrome, IBS, migraines   The patient presents for evaluation of headaches. 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 Headaches are occurring almost every day. Current headaches are described as sudden severe pain (feels like "getting hit with a hammer") in her left vertex and temple. This lasts for a few seconds at a time. She will typically have 2-3 of these in a row, which is then followed by a sensation of scalp tenderness and vice-like squeezing around her head. Squeezing headache can last for several hours at a time. Her most severe ones have been associated with nausea and dry heaving. Headaches have been waking her up from sleep at night. She has not been able to identify any triggers for her headaches.    She was prescribed sumatriptan by her PCP which did help relieve the vice-like headache after a few hours. Also tried Nurtec for rescue but did not find this effective.   Headache History: Onset: 3 weeks ago Aura: none Location: left temple/vertex Associated Symptoms:             Photophobia: no             Phonophobia: no             Nausea: yes Worse with activity?:  yes Duration of headaches: seconds-1 minute   Headache days per month: 30 Headache free days per month: 0   Current Treatment: Abortive Imitrex   Preventative Topamax 100 mg QHS   Prior Therapies                                 Rescue: Imitrex Nurtec - lack of efficacy Tizanidine 4 mg PRN   Prevention: Cymbalta 30 mg QHS Gabapentin 300 mg QHS - fatigue Topamax 100 mg at bedtime   REVIEW OF SYSTEMS: Constitutional: No fevers, chills, sweats, or change in appetite Eyes: No visual changes, double vision, eye pain Ear, nose and throat: No hearing loss, ear pain, nasal congestion, sore throat Cardiovascular: No chest pain, palpitations Respiratory:  No  shortness of breath at rest or with exertion.   No wheezes GastrointestinaI: No nausea, vomiting, diarrhea, abdominal pain, fecal incontinence Genitourinary:  No dysuria, urinary retention or frequency.  No nocturia. Musculoskeletal:  No neck pain, back pain Integumentary: No rash, pruritus, skin lesions Neurological: as above Psychiatric: No depression at this time.  No anxiety Endocrine: No palpitations, diaphoresis, change in appetite, change in weigh or increased thirst Hematologic/Lymphatic:  No anemia, purpura, petechiae. Allergic/Immunologic: No itchy/runny eyes, nasal congestion, recent allergic reactions, rashes  ALLERGIES: Allergies  Allergen Reactions   Morphine And Codeine Anaphylaxis   Penicillins Rash    Has patient had a PCN reaction causing immediate rash, facial/tongue/throat swelling, SOB or lightheadedness with hypotension: No Has patient had a PCN reaction causing severe rash involving mucus membranes or skin necrosis: No Has patient had a PCN reaction that required hospitalization No Has patient had a PCN reaction occurring within the last 10 years: No If all of the above answers are "NO", then may proceed with Cephalosporin use.     HOME MEDICATIONS:  Current Outpatient Medications:    cetirizine (ZYRTEC) 10 MG tablet, Take 10 mg by mouth as needed for allergies., Disp: , Rfl:    DULoxetine (CYMBALTA) 20 MG capsule, Take 30 mg by mouth at bedtime., Disp: , Rfl:    gabapentin (NEURONTIN) 100 MG capsule, Take 1 capsule (100 mg total) by mouth at bedtime., Disp: 90 capsule, Rfl: 1   Hyoscyamine Sulfate SL (LEVSIN/SL) 0.125 MG SUBL, Place 0.125 mg under the tongue every 4 (four) hours as needed (abdominal pain)., Disp: 30 tablet, Rfl: 0   indomethacin (INDOCIN) 25 MG capsule, One po prn headache.  Take with food, Disp: 20 capsule, Rfl: 1   Magnesium 500 MG CAPS, Take by mouth at bedtime., Disp: , Rfl:    ondansetron (ZOFRAN-ODT) 4 MG disintegrating tablet, Take 1  tablet (4 mg total) by mouth every 8 (eight) hours as needed for nausea or vomiting., Disp: 10 tablet, Rfl: 0   SUMAtriptan (IMITREX) 100 MG tablet, Take 1 tablet (100 mg total) by mouth once as needed for up to 1 dose for migraine. May repeat in 2 hours if headache persists or recurs., Disp: 10 tablet, Rfl: 2   tiZANidine (ZANAFLEX) 4 MG tablet, Take 4 mg by mouth every 6 (six) hours as needed for muscle spasms., Disp: , Rfl:    topiramate (TOPAMAX) 100 MG tablet, Take 1/2 pill (50 mg) in the AM in 1 pill (100 mg) at bedtime, Disp: 45 tablet, Rfl: 6  PAST MEDICAL HISTORY: Past Medical History:  Diagnosis Date   Anal fissure    CREST syndrome (HCC)    rheumotology--- dr Dorathy Kinsman (  [facility])  treated w/ plaquenil   Family history of adverse reaction to anesthesia    mother--- ponv   Gastropathy    GERD (gastroesophageal reflux disease)    Hemorrhoids    internal and external   Hemorrhoids    IBS (irritable bowel syndrome)    IBS (irritable bowel syndrome)    Lyme disease 2017   per pt dx 2017 w/ possible residual but unsure due to CREST syndrome   Migraine    PONV (postoperative nausea and vomiting)    severe    PAST SURGICAL HISTORY: Past Surgical History:  Procedure Laterality Date   DILATION AND CURETTAGE OF UTERUS  2003   EVALUATION UNDER ANESTHESIA WITH HEMORRHOIDECTOMY N/A 11/15/2020   Procedure: EXAM UNDER ANESTHESIA WITH HEMORRHOIDPEXY,;  Surgeon: Karie Soda, MD;  Location[place] [facility] [place];  Service: General;  Laterality: N/A;   LAPAROSCOPIC CHOLECYSTECTOMY  1997   SPHINCTEROTOMY N/A 11/15/2020   Procedure: SPHINCTEROTOMY;  Surgeon: Karie Soda, MD;  Location: [facility] [place];  Service: General;  Laterality: N/A;   TONSILLECTOMY  child   W/   BILATERAL TYMPANOPLASTY W/ TUBES   VAGINAL HYSTERECTOMY  2005   W/ UNILATERAL SALPINGOOPHORECTOMY    FAMILY HISTORY: Family History  Problem Relation Age of Onset   Crohn's disease Sister    Colon  polyps Sister    Diabetes Maternal Aunt    Liver disease Maternal Aunt    COPD Maternal Aunt    Diabetes Maternal Grandmother    Alcohol abuse Maternal Grandfather    Colon cancer Neg Hx    Rectal cancer Neg Hx    Stomach cancer Neg Hx    Esophageal cancer Neg Hx    Pancreatic cancer Neg Hx    Migraines Neg Hx     SOCIAL HISTORY: Social History   Socioeconomic History   Marital status: Married    Spouse name: Not on file   Number of children:  2   Years of education: Not on file   Highest education level: Not on file  Occupational History    Employer: [employer]  Tobacco Use   Smoking status: Never   Smokeless tobacco: Never  Vaping Use   Vaping status: Never Used  Substance and Sexual Activity   Alcohol use: No   Drug use: Never   Sexual activity: Yes    Birth control/protection: Surgical    Comment: hyst  Other Topics Concern   Not on file  Social History Narrative   Pt lives with husband    Pt works    Social Drivers of Corporate investment banker Strain: Not on Ship broker Insecurity: Not on file  Transportation Needs: Not on file  Physical Activity: Not on file  Stress: Not on file  Social Connections: Not on file  Intimate Partner Violence: Not on file       PHYSICAL EXAM  Vitals:   02/15/24 1115  BP: 132/77  Pulse: 81  Weight: 228 lb (103.4 kg)  Height: 5\' 3"  (1.6 m)    Body mass index is 40.39 kg/m.   General: The patient is well-developed and well-nourished and in no acute distress  HEENT:  Head is [place]/AT.  Sclera are anicteric.  Funduscopic exam shows normal optic discs and retinal vessels.  Neck: No carotid bruits are noted.  The neck is mildly tender over the occiput, left greater than right.  Cardiovascular: The heart has a regular rate and rhythm with a normal S1 and S2. There  were no murmurs, gallops or rubs.    Skin: Extremities are without rash or  edema.  Musculoskeletal: She has mild tenderness over the left bursa and a  slightly reduced left shoulder range of motion. Neurologic Exam  Mental status: The patient is alert and oriented x 3 at the time of the examination. The patient has apparent normal recent and remote memory, with an apparently normal attention span and concentration ability.   Speech is normal.  Cranial nerves: Extraocular movements are full. Pupils are equal, round, and reactive to light and accomodation.  There is good facial sensation to soft touch bilaterally.Facial strength is normal.  Trapezius and sternocleidomastoid strength is normal. No dysarthria is noted.  The tongue is midline, and the patient has symmetric elevation of the soft palate. No obvious hearing deficits are noted.  Motor:  Muscle bulk is normal.   Tone is normal. Strength is  5 / 5 in all 4 extremities.   Sensory: Sensory testing is intact to pinprick, soft touch and vibration sensation in all 4 extremities.  Coordination: Cerebellar testing reveals good finger-nose-finger and heel-to-shin bilaterally.  Gait and station: Station is normal.   Gait is normal. Tandem gait is normal. Romberg is negative.   Reflexes: Deep tendon reflexes are symmetric and normal bilaterally.       DIAGNOSTIC DATA (LABS, IMAGING, TESTING) - I reviewed patient records, labs, notes, testing and imaging myself where available.  Lab Results  Component Value Date   WBC 5.3 10/08/2023   HGB 16.5 (H) 10/08/2023   HCT 48.6 (H) 10/08/2023   MCV 89.0 10/08/2023   PLT 224 10/08/2023      Component Value Date/Time   NA 141 10/20/2023 0905   K 4.5 10/20/2023 0905   CL 104 10/20/2023 0905   CO2 18 (L) 10/20/2023 0905   GLUCOSE 82 10/20/2023 0905   GLUCOSE 96 10/08/2023 1127   BUN 18 10/20/2023 0905   CREATININE 1.06 (H) 10/20/2023 0905   CALCIUM 9.8 10/20/2023 0905   PROT 6.9 10/20/2023 0905   ALBUMIN 4.5 10/20/2023 0905   AST 20 10/20/2023 0905   ALT 16 10/20/2023 0905   ALKPHOS 70 10/20/2023 0905   BILITOT 0.5 10/20/2023 0905    GFRNONAA >60 10/08/2023 1127   GFRAA >60 06/29/2020 1345   No results found for: "CHOL", "HDL", "LDLCALC", "LDLDIRECT", "TRIG", "CHOLHDL" Lab Results  Component Value Date   HGBA1C 5.1 10/19/2023   No results found for: "VITAMINB12" Lab Results  Component Value Date   TSH 1.550 10/20/2023       ASSESSMENT AND PLAN  Severe headache  Primary thunderclap headache - Plan: ketorolac (TORADOL) injection 60 mg  Bursitis of left shoulder  Episodic migraine   Her headache is difficult to classify.  It is distinct from the episodic migraines that she has been experiencing for many years.  It is possible that she is having an acute tension type headache that is triggering a more intense migraine headache.  Because these are occurring about every 2 months, we need to optimize her acute therapy.    Next time a similar headache occurs she will take Zofran with sumatriptan at the onset.  Then, if the headache is not better in about 30 minutes she can add an indomethacin 25 mg. If this acute therapy is not effective consider Nurtec or Ubrelvy.  If the frequency increases consider one of the anti-CGRP agents Toradol 60 mg IM now to help eliminate the remnant of the current headache.  This  may also help her bursitis.   She will return to see me in 1 year for regular visit but call sooner if there are new or worsening neurologic symptoms   45-minute office visit with the majority of the time spent face-to-face for history and physical, discussion/counseling and decision-making.  Additional time with record review and documentation.    Jelan Batterton A. Epimenio Foot, MD, [facility] 02/15/2024, 12:46 PM Certified in Neurology, Clinical Neurophysiology, Sleep Medicine and Neuroimaging  [facility] [address], [address] [place], [place] [address] [phone]

## 2024-02-15 NOTE — Progress Notes (Signed)
 Per Dr Epimenio Foot please give toradol injection to patient Gave injection placed bandage on injection site Pt states feels better pt went to check out

## 2025-02-14 ENCOUNTER — Ambulatory Visit: Admitting: Neurology
